# Patient Record
Sex: Female | Born: 1950 | Race: White | Hispanic: No | Marital: Married | Smoking: Former smoker
Health system: Southern US, Community
[De-identification: ages and names within clinical notes are randomized; demographics above are authoritative.]

## PROBLEM LIST (undated history)

## (undated) DIAGNOSIS — C7931 Secondary malignant neoplasm of brain: Secondary | ICD-10-CM

## (undated) DIAGNOSIS — C169 Malignant neoplasm of stomach, unspecified: Secondary | ICD-10-CM

## (undated) DIAGNOSIS — H409 Unspecified glaucoma: Secondary | ICD-10-CM

## (undated) DIAGNOSIS — C779 Secondary and unspecified malignant neoplasm of lymph node, unspecified: Secondary | ICD-10-CM

## (undated) DIAGNOSIS — F419 Anxiety disorder, unspecified: Secondary | ICD-10-CM

## (undated) DIAGNOSIS — K219 Gastro-esophageal reflux disease without esophagitis: Secondary | ICD-10-CM

## (undated) DIAGNOSIS — C50911 Malignant neoplasm of unspecified site of right female breast: Secondary | ICD-10-CM

## (undated) DIAGNOSIS — M1990 Unspecified osteoarthritis, unspecified site: Secondary | ICD-10-CM

## (undated) DIAGNOSIS — Z8489 Family history of other specified conditions: Secondary | ICD-10-CM

## (undated) HISTORY — DX: Unspecified osteoarthritis, unspecified site: M19.90

## (undated) HISTORY — DX: Secondary malignant neoplasm of brain: C79.31

## (undated) HISTORY — DX: Malignant neoplasm of unspecified site of right female breast: C50.911

## (undated) HISTORY — DX: Malignant neoplasm of stomach, unspecified: C16.9

## (undated) HISTORY — DX: Secondary and unspecified malignant neoplasm of lymph node, unspecified: C77.9

## (undated) HISTORY — PX: ABDOMINAL HYSTERECTOMY: SHX81

## (undated) HISTORY — DX: Gastro-esophageal reflux disease without esophagitis: K21.9

---

## 2004-08-31 ENCOUNTER — Emergency Department: Payer: Self-pay | Admitting: Emergency Medicine

## 2004-09-29 ENCOUNTER — Ambulatory Visit: Payer: Self-pay | Admitting: Internal Medicine

## 2005-11-04 ENCOUNTER — Ambulatory Visit: Payer: Self-pay | Admitting: Internal Medicine

## 2006-11-17 ENCOUNTER — Ambulatory Visit: Payer: Self-pay | Admitting: Internal Medicine

## 2007-11-23 ENCOUNTER — Ambulatory Visit: Payer: Self-pay | Admitting: Internal Medicine

## 2008-01-25 ENCOUNTER — Ambulatory Visit: Payer: Self-pay | Admitting: Unknown Physician Specialty

## 2009-02-25 ENCOUNTER — Ambulatory Visit: Payer: Self-pay | Admitting: Internal Medicine

## 2010-07-01 ENCOUNTER — Encounter
Admission: RE | Admit: 2010-07-01 | Discharge: 2010-07-01 | Payer: Self-pay | Source: Home / Self Care | Attending: Specialist | Admitting: Specialist

## 2012-09-02 ENCOUNTER — Ambulatory Visit: Payer: Self-pay | Admitting: Family Medicine

## 2012-09-02 LAB — COMPREHENSIVE METABOLIC PANEL
Albumin: 3.7 g/dL (ref 3.4–5.0)
BUN: 16 mg/dL (ref 7–18)
Chloride: 102 mmol/L (ref 98–107)
Glucose: 98 mg/dL (ref 65–99)
Osmolality: 281 (ref 275–301)
SGPT (ALT): 21 U/L (ref 12–78)
Sodium: 140 mmol/L (ref 136–145)
Total Protein: 7.8 g/dL (ref 6.4–8.2)

## 2012-09-02 LAB — URINALYSIS, COMPLETE: Ph: 6 (ref 4.5–8.0)

## 2012-09-02 LAB — CBC WITH DIFFERENTIAL/PLATELET
Basophil #: 0 10*3/uL (ref 0.0–0.1)
Eosinophil %: 2 %
Lymphocyte #: 1.2 10*3/uL (ref 1.0–3.6)
Monocyte %: 12.8 %
Neutrophil #: 2.7 10*3/uL (ref 1.4–6.5)
RBC: 4.52 10*6/uL (ref 3.80–5.20)
RDW: 15 % — ABNORMAL HIGH (ref 11.5–14.5)
WBC: 4.6 10*3/uL (ref 3.6–11.0)

## 2014-04-08 DIAGNOSIS — K219 Gastro-esophageal reflux disease without esophagitis: Secondary | ICD-10-CM | POA: Insufficient documentation

## 2014-04-11 ENCOUNTER — Ambulatory Visit: Payer: Self-pay | Admitting: Family Medicine

## 2014-04-19 ENCOUNTER — Other Ambulatory Visit (HOSPITAL_COMMUNITY): Payer: Self-pay | Admitting: Diagnostic Radiology

## 2014-04-19 ENCOUNTER — Ambulatory Visit: Payer: Self-pay | Admitting: Surgery

## 2014-04-23 ENCOUNTER — Ambulatory Visit: Payer: Self-pay | Admitting: Oncology

## 2014-04-23 LAB — CBC CANCER CENTER
BASOS ABS: 0 x10 3/mm (ref 0.0–0.1)
BASOS PCT: 0.3 %
EOS ABS: 0.2 x10 3/mm (ref 0.0–0.7)
EOS PCT: 2.9 %
HCT: 35.9 % (ref 35.0–47.0)
HGB: 11.2 g/dL — ABNORMAL LOW (ref 12.0–16.0)
LYMPHS ABS: 1.5 x10 3/mm (ref 1.0–3.6)
LYMPHS PCT: 22.1 %
MCH: 25 pg — AB (ref 26.0–34.0)
MCHC: 31.2 g/dL — AB (ref 32.0–36.0)
MCV: 80 fL (ref 80–100)
Monocyte #: 0.6 x10 3/mm (ref 0.2–0.9)
Monocyte %: 9.6 %
NEUTROS ABS: 4.3 x10 3/mm (ref 1.4–6.5)
Neutrophil %: 65.1 %
PLATELETS: 314 x10 3/mm (ref 150–440)
RBC: 4.48 10*6/uL (ref 3.80–5.20)
RDW: 17 % — AB (ref 11.5–14.5)
WBC: 6.6 x10 3/mm (ref 3.6–11.0)

## 2014-04-23 LAB — COMPREHENSIVE METABOLIC PANEL
ALT: 23 U/L
AST: 15 U/L (ref 15–37)
Albumin: 3.6 g/dL (ref 3.4–5.0)
Alkaline Phosphatase: 84 U/L
Anion Gap: 3 — ABNORMAL LOW (ref 7–16)
BILIRUBIN TOTAL: 0.1 mg/dL — AB (ref 0.2–1.0)
BUN: 16 mg/dL (ref 7–18)
CO2: 30 mmol/L (ref 21–32)
CREATININE: 0.76 mg/dL (ref 0.60–1.30)
Calcium, Total: 8.4 mg/dL — ABNORMAL LOW (ref 8.5–10.1)
Chloride: 108 mmol/L — ABNORMAL HIGH (ref 98–107)
EGFR (African American): >60
GLUCOSE: 100 mg/dL — AB (ref 65–99)
OSMOLALITY: 283 (ref 275–301)
Potassium: 4.3 mmol/L (ref 3.5–5.1)
SODIUM: 141 mmol/L (ref 136–145)
TOTAL PROTEIN: 7.4 g/dL (ref 6.4–8.2)

## 2014-04-24 LAB — IMMUNOHISTOCHEMICAL STAIN(S)

## 2014-04-24 LAB — CANCER ANTIGEN 27.29: CA 27.29: 13.3 U/mL (ref 0.0–38.6)

## 2014-05-01 ENCOUNTER — Ambulatory Visit: Payer: Self-pay | Admitting: Surgery

## 2014-05-01 HISTORY — PX: PORTACATH PLACEMENT: SHX2246

## 2014-05-02 LAB — CBC WITH DIFFERENTIAL/PLATELET
BASOS PCT: 0.4 %
Basophil #: 0 10*3/uL (ref 0.0–0.1)
EOS ABS: 0.1 10*3/uL (ref 0.0–0.7)
Eosinophil %: 2.5 %
HCT: 32.1 % — ABNORMAL LOW (ref 35.0–47.0)
HGB: 10 g/dL — ABNORMAL LOW (ref 12.0–16.0)
LYMPHS ABS: 1.2 10*3/uL (ref 1.0–3.6)
Lymphocyte %: 24.4 %
MCH: 24.8 pg — ABNORMAL LOW (ref 26.0–34.0)
MCHC: 31.3 g/dL — ABNORMAL LOW (ref 32.0–36.0)
MCV: 79 fL — ABNORMAL LOW (ref 80–100)
MONO ABS: 0.4 x10 3/mm (ref 0.2–0.9)
MONOS PCT: 8.8 %
NEUTROS ABS: 3.1 10*3/uL (ref 1.4–6.5)
Neutrophil %: 63.9 %
Platelet: 264 10*3/uL (ref 150–440)
RBC: 4.05 10*6/uL (ref 3.80–5.20)
RDW: 16.7 % — ABNORMAL HIGH (ref 11.5–14.5)
WBC: 4.9 10*3/uL (ref 3.6–11.0)

## 2014-05-06 ENCOUNTER — Ambulatory Visit: Payer: Self-pay | Admitting: Oncology

## 2014-05-07 ENCOUNTER — Ambulatory Visit: Payer: Self-pay | Admitting: Oncology

## 2014-05-10 ENCOUNTER — Ambulatory Visit: Payer: Self-pay | Admitting: Gastroenterology

## 2014-05-22 LAB — CBC CANCER CENTER
Basophil #: 0 x10 3/mm (ref 0.0–0.1)
Basophil %: 0.2 %
EOS ABS: 0 x10 3/mm (ref 0.0–0.7)
EOS PCT: 0.2 %
HCT: 35.9 % (ref 35.0–47.0)
HGB: 11 g/dL — AB (ref 12.0–16.0)
Lymphocyte #: 1.9 x10 3/mm (ref 1.0–3.6)
Lymphocyte %: 12 %
MCH: 24 pg — ABNORMAL LOW (ref 26.0–34.0)
MCHC: 30.7 g/dL — ABNORMAL LOW (ref 32.0–36.0)
MCV: 78 fL — AB (ref 80–100)
MONO ABS: 0.9 x10 3/mm (ref 0.2–0.9)
MONOS PCT: 5.5 %
NEUTROS ABS: 12.9 x10 3/mm — AB (ref 1.4–6.5)
Neutrophil %: 82.1 %
Platelet: 267 x10 3/mm (ref 150–440)
RBC: 4.6 10*6/uL (ref 3.80–5.20)
RDW: 16.7 % — ABNORMAL HIGH (ref 11.5–14.5)
WBC: 15.8 x10 3/mm — AB (ref 3.6–11.0)

## 2014-05-29 LAB — CBC CANCER CENTER
BASOS ABS: 0 x10 3/mm (ref 0.0–0.1)
Basophil %: 0.4 %
Eosinophil #: 0 x10 3/mm (ref 0.0–0.7)
Eosinophil %: 0.1 %
HCT: 32.2 % — ABNORMAL LOW (ref 35.0–47.0)
HGB: 10.1 g/dL — ABNORMAL LOW (ref 12.0–16.0)
LYMPHS PCT: 18.8 %
Lymphocyte #: 1.7 x10 3/mm (ref 1.0–3.6)
MCH: 24.6 pg — AB (ref 26.0–34.0)
MCHC: 31.4 g/dL — AB (ref 32.0–36.0)
MCV: 79 fL — AB (ref 80–100)
Monocyte #: 0.5 x10 3/mm (ref 0.2–0.9)
Monocyte %: 5.8 %
NEUTROS ABS: 6.9 x10 3/mm — AB (ref 1.4–6.5)
Neutrophil %: 74.9 %
Platelet: 170 x10 3/mm (ref 150–440)
RBC: 4.1 10*6/uL (ref 3.80–5.20)
RDW: 17.5 % — ABNORMAL HIGH (ref 11.5–14.5)
WBC: 9.2 x10 3/mm (ref 3.6–11.0)

## 2014-06-04 ENCOUNTER — Ambulatory Visit: Payer: Self-pay | Admitting: Oncology

## 2014-06-04 LAB — PROTIME-INR
INR: 1
PROTHROMBIN TIME: 12.9 s (ref 11.5–14.7)

## 2014-06-05 LAB — CBC CANCER CENTER
BASOS ABS: 0 x10 3/mm (ref 0.0–0.1)
BASOS PCT: 0.5 %
Eosinophil #: 0 x10 3/mm (ref 0.0–0.7)
Eosinophil %: 0.2 %
HCT: 29.9 % — ABNORMAL LOW (ref 35.0–47.0)
HGB: 9.4 g/dL — ABNORMAL LOW (ref 12.0–16.0)
LYMPHS ABS: 1.4 x10 3/mm (ref 1.0–3.6)
Lymphocyte %: 29.1 %
MCH: 24.8 pg — ABNORMAL LOW (ref 26.0–34.0)
MCHC: 31.5 g/dL — AB (ref 32.0–36.0)
MCV: 79 fL — AB (ref 80–100)
MONO ABS: 0.4 x10 3/mm (ref 0.2–0.9)
MONOS PCT: 8.3 %
Neutrophil #: 3 x10 3/mm (ref 1.4–6.5)
Neutrophil %: 61.9 %
Platelet: 217 x10 3/mm (ref 150–440)
RBC: 3.79 10*6/uL — ABNORMAL LOW (ref 3.80–5.20)
RDW: 18 % — AB (ref 11.5–14.5)
WBC: 4.8 x10 3/mm (ref 3.6–11.0)

## 2014-06-07 ENCOUNTER — Ambulatory Visit: Payer: Self-pay | Admitting: Oncology

## 2014-06-10 LAB — CBC CANCER CENTER
BASOS ABS: 0 x10 3/mm (ref 0.0–0.1)
Basophil %: 0.7 %
EOS ABS: 0.1 x10 3/mm (ref 0.0–0.7)
Eosinophil %: 1.6 %
HCT: 31.5 % — AB (ref 35.0–47.0)
HGB: 9.8 g/dL — ABNORMAL LOW (ref 12.0–16.0)
Lymphocyte #: 1.1 x10 3/mm (ref 1.0–3.6)
Lymphocyte %: 27.7 %
MCH: 24.3 pg — AB (ref 26.0–34.0)
MCHC: 31.1 g/dL — AB (ref 32.0–36.0)
MCV: 78 fL — AB (ref 80–100)
MONOS PCT: 9.9 %
Monocyte #: 0.4 x10 3/mm (ref 0.2–0.9)
Neutrophil #: 2.5 x10 3/mm (ref 1.4–6.5)
Neutrophil %: 60.1 %
PLATELETS: 216 x10 3/mm (ref 150–440)
RBC: 4.03 10*6/uL (ref 3.80–5.20)
RDW: 18.6 % — AB (ref 11.5–14.5)
WBC: 4.1 x10 3/mm (ref 3.6–11.0)

## 2014-06-10 LAB — COMPREHENSIVE METABOLIC PANEL
ALK PHOS: 84 U/L
Albumin: 3.2 g/dL — ABNORMAL LOW (ref 3.4–5.0)
Anion Gap: 8 (ref 7–16)
BUN: 19 mg/dL — AB (ref 7–18)
Bilirubin,Total: 0.2 mg/dL (ref 0.2–1.0)
CALCIUM: 8.1 mg/dL — AB (ref 8.5–10.1)
CO2: 27 mmol/L (ref 21–32)
CREATININE: 0.73 mg/dL (ref 0.60–1.30)
Chloride: 104 mmol/L (ref 98–107)
EGFR (African American): >60
GLUCOSE: 93 mg/dL (ref 65–99)
OSMOLALITY: 279 (ref 275–301)
Potassium: 3.8 mmol/L (ref 3.5–5.1)
SGOT(AST): 12 U/L — ABNORMAL LOW (ref 15–37)
SGPT (ALT): 19 U/L
SODIUM: 139 mmol/L (ref 136–145)
Total Protein: 6.6 g/dL (ref 6.4–8.2)

## 2014-06-17 LAB — CBC CANCER CENTER
Basophil #: 0 x10 3/mm (ref 0.0–0.1)
Basophil %: 0.3 %
EOS PCT: 0.3 %
Eosinophil #: 0 x10 3/mm (ref 0.0–0.7)
HCT: 30.5 % — ABNORMAL LOW (ref 35.0–47.0)
HGB: 9.5 g/dL — ABNORMAL LOW (ref 12.0–16.0)
Lymphocyte #: 1.5 x10 3/mm (ref 1.0–3.6)
Lymphocyte %: 23.3 %
MCH: 24.5 pg — ABNORMAL LOW (ref 26.0–34.0)
MCHC: 31.2 g/dL — ABNORMAL LOW (ref 32.0–36.0)
MCV: 79 fL — ABNORMAL LOW (ref 80–100)
MONOS PCT: 10.4 %
Monocyte #: 0.7 x10 3/mm (ref 0.2–0.9)
NEUTROS PCT: 65.7 %
Neutrophil #: 4.3 x10 3/mm (ref 1.4–6.5)
PLATELETS: 158 x10 3/mm (ref 150–440)
RBC: 3.88 10*6/uL (ref 3.80–5.20)
RDW: 18.7 % — ABNORMAL HIGH (ref 11.5–14.5)
WBC: 6.5 x10 3/mm (ref 3.6–11.0)

## 2014-06-24 LAB — CBC CANCER CENTER
Basophil #: 0 x10 3/mm (ref 0.0–0.1)
Basophil %: 0.2 %
EOS PCT: 0.1 %
Eosinophil #: 0 x10 3/mm (ref 0.0–0.7)
HCT: 29.4 % — AB (ref 35.0–47.0)
HGB: 9.2 g/dL — ABNORMAL LOW (ref 12.0–16.0)
LYMPHS ABS: 1.4 x10 3/mm (ref 1.0–3.6)
Lymphocyte %: 23.3 %
MCH: 25.1 pg — AB (ref 26.0–34.0)
MCHC: 31.4 g/dL — ABNORMAL LOW (ref 32.0–36.0)
MCV: 80 fL (ref 80–100)
MONOS PCT: 6.7 %
Monocyte #: 0.4 x10 3/mm (ref 0.2–0.9)
NEUTROS ABS: 4 x10 3/mm (ref 1.4–6.5)
Neutrophil %: 69.7 %
Platelet: 188 x10 3/mm (ref 150–440)
RBC: 3.68 10*6/uL — AB (ref 3.80–5.20)
RDW: 20 % — AB (ref 11.5–14.5)
WBC: 5.8 x10 3/mm (ref 3.6–11.0)

## 2014-07-01 LAB — COMPREHENSIVE METABOLIC PANEL
ALBUMIN: 3.2 g/dL — AB (ref 3.4–5.0)
ALK PHOS: 86 U/L
ANION GAP: 9 (ref 7–16)
BUN: 15 mg/dL (ref 7–18)
Bilirubin,Total: 0.2 mg/dL (ref 0.2–1.0)
CREATININE: 0.79 mg/dL (ref 0.60–1.30)
Calcium, Total: 7.9 mg/dL — ABNORMAL LOW (ref 8.5–10.1)
Chloride: 106 mmol/L (ref 98–107)
Co2: 26 mmol/L (ref 21–32)
EGFR (Non-African Amer.): >60
GLUCOSE: 120 mg/dL — AB (ref 65–99)
Osmolality: 283 (ref 275–301)
POTASSIUM: 4.1 mmol/L (ref 3.5–5.1)
SGOT(AST): 13 U/L — ABNORMAL LOW (ref 15–37)
SGPT (ALT): 19 U/L
SODIUM: 141 mmol/L (ref 136–145)
Total Protein: 6.4 g/dL (ref 6.4–8.2)

## 2014-07-01 LAB — CBC CANCER CENTER
Basophil #: 0 x10 3/mm (ref 0.0–0.1)
Basophil %: 0.5 %
EOS ABS: 0 x10 3/mm (ref 0.0–0.7)
EOS PCT: 0.2 %
HCT: 31.4 % — ABNORMAL LOW (ref 35.0–47.0)
HGB: 9.8 g/dL — ABNORMAL LOW (ref 12.0–16.0)
LYMPHS ABS: 1.4 x10 3/mm (ref 1.0–3.6)
Lymphocyte %: 39.2 %
MCH: 24.9 pg — AB (ref 26.0–34.0)
MCHC: 31.2 g/dL — AB (ref 32.0–36.0)
MCV: 80 fL (ref 80–100)
MONO ABS: 0.4 x10 3/mm (ref 0.2–0.9)
Monocyte %: 10.7 %
NEUTROS PCT: 49.4 %
Neutrophil #: 1.8 x10 3/mm (ref 1.4–6.5)
PLATELETS: 214 x10 3/mm (ref 150–440)
RBC: 3.94 10*6/uL (ref 3.80–5.20)
RDW: 19.7 % — ABNORMAL HIGH (ref 11.5–14.5)
WBC: 3.7 x10 3/mm (ref 3.6–11.0)

## 2014-07-08 ENCOUNTER — Ambulatory Visit: Payer: Self-pay | Admitting: Oncology

## 2014-07-08 LAB — CBC CANCER CENTER
BASOS PCT: 0.2 %
Basophil #: 0 x10 3/mm (ref 0.0–0.1)
EOS PCT: 0.3 %
Eosinophil #: 0 x10 3/mm (ref 0.0–0.7)
HCT: 28.3 % — ABNORMAL LOW (ref 35.0–47.0)
HGB: 9.1 g/dL — AB (ref 12.0–16.0)
LYMPHS PCT: 19.6 %
Lymphocyte #: 1.7 x10 3/mm (ref 1.0–3.6)
MCH: 25.4 pg — ABNORMAL LOW (ref 26.0–34.0)
MCHC: 32.3 g/dL (ref 32.0–36.0)
MCV: 79 fL — ABNORMAL LOW (ref 80–100)
MONO ABS: 1.1 x10 3/mm — AB (ref 0.2–0.9)
MONOS PCT: 13.3 %
NEUTROS PCT: 66.6 %
Neutrophil #: 5.8 x10 3/mm (ref 1.4–6.5)
PLATELETS: 177 x10 3/mm (ref 150–440)
RBC: 3.6 10*6/uL — AB (ref 3.80–5.20)
RDW: 20.2 % — ABNORMAL HIGH (ref 11.5–14.5)
WBC: 8.6 x10 3/mm (ref 3.6–11.0)

## 2014-07-15 LAB — CBC CANCER CENTER
Basophil #: 0 x10 3/mm (ref 0.0–0.1)
Basophil %: 0.3 %
Eosinophil #: 0 x10 3/mm (ref 0.0–0.7)
Eosinophil %: 0.1 %
HCT: 30.2 % — ABNORMAL LOW (ref 35.0–47.0)
HGB: 9.6 g/dL — ABNORMAL LOW (ref 12.0–16.0)
Lymphocyte %: 28.4 %
Lymphs Abs: 1.9 x10 3/mm (ref 1.0–3.6)
MCH: 25.6 pg — ABNORMAL LOW (ref 26.0–34.0)
MCHC: 31.7 g/dL — ABNORMAL LOW (ref 32.0–36.0)
MCV: 81 fL (ref 80–100)
Monocyte #: 0.6 x10 3/mm (ref 0.2–0.9)
Monocyte %: 9 %
Neutrophil #: 4.1 x10 3/mm (ref 1.4–6.5)
Neutrophil %: 62.2 %
Platelet: 175 x10 3/mm (ref 150–440)
RBC: 3.74 x10 6/mm — ABNORMAL LOW (ref 3.80–5.20)
RDW: 21.4 % — ABNORMAL HIGH (ref 11.5–14.5)
WBC: 6.5 x10 3/mm (ref 3.6–11.0)

## 2014-08-06 ENCOUNTER — Ambulatory Visit
Admit: 2014-08-06 | Disposition: A | Discharge status: Home / Self Care | Payer: Self-pay | Attending: Oncology | Admitting: Oncology

## 2014-09-03 LAB — CBC CANCER CENTER
BASOS ABS: 0 x10 3/mm (ref 0.0–0.1)
Basophil %: 0.6 %
Eosinophil #: 0 x10 3/mm (ref 0.0–0.7)
Eosinophil %: 0.1 %
HCT: 31.2 % — ABNORMAL LOW (ref 35.0–47.0)
HGB: 10.2 g/dL — AB (ref 12.0–16.0)
LYMPHS ABS: 1 x10 3/mm (ref 1.0–3.6)
LYMPHS PCT: 29.7 %
MCH: 28.4 pg (ref 26.0–34.0)
MCHC: 32.6 g/dL (ref 32.0–36.0)
MCV: 87 fL (ref 80–100)
MONOS PCT: 12.9 %
Monocyte #: 0.4 x10 3/mm (ref 0.2–0.9)
NEUTROS PCT: 56.7 %
Neutrophil #: 1.9 x10 3/mm (ref 1.4–6.5)
PLATELETS: 168 x10 3/mm (ref 150–440)
RBC: 3.57 10*6/uL — ABNORMAL LOW (ref 3.80–5.20)
RDW: 20.7 % — AB (ref 11.5–14.5)
WBC: 3.3 x10 3/mm — AB (ref 3.6–11.0)

## 2014-09-03 LAB — COMPREHENSIVE METABOLIC PANEL
ALK PHOS: 59 U/L
ALT: 8 U/L — AB
AST: 16 U/L
Albumin: 3.8 g/dL
Anion Gap: 8 (ref 7–16)
BUN: 8 mg/dL
Bilirubin,Total: 0.4 mg/dL
CALCIUM: 8.6 mg/dL — AB
CHLORIDE: 100 mmol/L — AB
CO2: 27 mmol/L
Creatinine: 0.68 mg/dL
EGFR (African American): >60
Glucose: 92 mg/dL
POTASSIUM: 3.8 mmol/L
Sodium: 135 mmol/L
Total Protein: 6.4 g/dL — ABNORMAL LOW

## 2014-09-03 LAB — CREATININE, SERUM: Creatine, Serum: 0.68

## 2014-09-03 LAB — MAGNESIUM: MAGNESIUM: 1.9 mg/dL

## 2014-09-06 ENCOUNTER — Ambulatory Visit
Admit: 2014-09-06 | Disposition: A | Discharge status: Home / Self Care | Payer: Self-pay | Attending: Oncology | Admitting: Oncology

## 2014-09-10 LAB — CBC CANCER CENTER
Basophil #: 0 x10 3/mm (ref 0.0–0.1)
Basophil %: 0.3 %
EOS PCT: 0.2 %
Eosinophil #: 0 x10 3/mm (ref 0.0–0.7)
HCT: 29.6 % — AB (ref 35.0–47.0)
HGB: 9.8 g/dL — ABNORMAL LOW (ref 12.0–16.0)
LYMPHS ABS: 0.8 x10 3/mm — AB (ref 1.0–3.6)
Lymphocyte %: 27.5 %
MCH: 28.9 pg (ref 26.0–34.0)
MCHC: 33.2 g/dL (ref 32.0–36.0)
MCV: 87 fL (ref 80–100)
MONO ABS: 0.6 x10 3/mm (ref 0.2–0.9)
Monocyte %: 18 %
NEUTROS ABS: 1.7 x10 3/mm (ref 1.4–6.5)
Neutrophil %: 54 %
Platelet: 82 x10 3/mm — ABNORMAL LOW (ref 150–440)
RBC: 3.4 10*6/uL — ABNORMAL LOW (ref 3.80–5.20)
RDW: 19.3 % — AB (ref 11.5–14.5)
WBC: 3.1 x10 3/mm — ABNORMAL LOW (ref 3.6–11.0)

## 2014-09-10 LAB — POTASSIUM: Potassium: 3.6 mmol/L

## 2014-09-10 LAB — MAGNESIUM: MAGNESIUM: 1.7 mg/dL

## 2014-09-17 LAB — CBC CANCER CENTER
BASOS PCT: 0.2 %
Basophil #: 0 x10 3/mm (ref 0.0–0.1)
EOS PCT: 0 %
Eosinophil #: 0 x10 3/mm (ref 0.0–0.7)
HCT: 31.8 % — ABNORMAL LOW (ref 35.0–47.0)
HGB: 10.5 g/dL — ABNORMAL LOW (ref 12.0–16.0)
LYMPHS ABS: 1.1 x10 3/mm (ref 1.0–3.6)
Lymphocyte %: 18 %
MCH: 29.2 pg (ref 26.0–34.0)
MCHC: 33.1 g/dL (ref 32.0–36.0)
MCV: 88 fL (ref 80–100)
MONO ABS: 0.4 x10 3/mm (ref 0.2–0.9)
Monocyte %: 6.8 %
Neutrophil #: 4.8 x10 3/mm (ref 1.4–6.5)
Neutrophil %: 75 %
PLATELETS: 121 x10 3/mm — AB (ref 150–440)
RBC: 3.61 10*6/uL — ABNORMAL LOW (ref 3.80–5.20)
RDW: 19.5 % — ABNORMAL HIGH (ref 11.5–14.5)
WBC: 6.4 x10 3/mm (ref 3.6–11.0)

## 2014-09-24 LAB — COMPREHENSIVE METABOLIC PANEL
ALBUMIN: 3.7 g/dL
ANION GAP: 6 — AB (ref 7–16)
AST: 18 U/L
Alkaline Phosphatase: 59 U/L
BUN: 13 mg/dL
Bilirubin,Total: 0.2 mg/dL — ABNORMAL LOW
CALCIUM: 8.4 mg/dL — AB
CO2: 30 mmol/L
Chloride: 101 mmol/L
Creatinine: 0.77 mg/dL
Glucose: 114 mg/dL — ABNORMAL HIGH
Potassium: 4.2 mmol/L
SGPT (ALT): 11 U/L — ABNORMAL LOW
SODIUM: 137 mmol/L
Total Protein: 6.2 g/dL — ABNORMAL LOW

## 2014-09-24 LAB — CBC CANCER CENTER
BASOS ABS: 0 x10 3/mm (ref 0.0–0.1)
BASOS PCT: 0.3 %
Eosinophil #: 0 x10 3/mm (ref 0.0–0.7)
Eosinophil %: 0.1 %
HCT: 28.8 % — AB (ref 35.0–47.0)
HGB: 9.4 g/dL — AB (ref 12.0–16.0)
LYMPHS ABS: 1.1 x10 3/mm (ref 1.0–3.6)
Lymphocyte %: 22.8 %
MCH: 29.3 pg (ref 26.0–34.0)
MCHC: 32.5 g/dL (ref 32.0–36.0)
MCV: 90 fL (ref 80–100)
MONOS PCT: 10.4 %
Monocyte #: 0.5 x10 3/mm (ref 0.2–0.9)
NEUTROS ABS: 3.1 x10 3/mm (ref 1.4–6.5)
Neutrophil %: 66.4 %
Platelet: 100 x10 3/mm — ABNORMAL LOW (ref 150–440)
RBC: 3.2 10*6/uL — ABNORMAL LOW (ref 3.80–5.20)
RDW: 19.4 % — AB (ref 11.5–14.5)
WBC: 4.6 x10 3/mm (ref 3.6–11.0)

## 2014-09-24 LAB — MAGNESIUM: Magnesium: 2 mg/dL

## 2014-09-27 ENCOUNTER — Ambulatory Visit
Admit: 2014-09-27 | Disposition: A | Discharge status: Home / Self Care | Payer: Self-pay | Attending: Gastroenterology | Admitting: Gastroenterology

## 2014-09-28 NOTE — Op Note (Signed)
PATIENT NAME:  Kimberly Thornton, HIERONYMUS MR#:  [id number] DATE OF BIRTH:  1951/01/07  DATE OF PROCEDURE:  05/01/2014  PREOPERATIVE DIAGNOSIS: Breast cancer.   PREOPERATIVE DIAGNOSIS: Breast cancer.   OPERATION: Port-A-Cath placement.   SURGEON: Rodena Goldmann III, MD  ANESTHESIA: Monitored anesthetic care.   INTRAOPERATIVE CONSULTATION: Katha Cabal, MD  OPERATIVE PROCEDURE: With the patient in the supine position after the induction of appropriate intravenous sedation, the patient's left chest and neck were prepped with ChloraPrep and draped with sterile towels. The left side was chosen because of her breast carcinoma on the right side. A shoulder roll was placed. The patient was appropriately positioned. A small incision was made under the clavicle on the left side. The subclavian vein approach was utilized. The subclavian artery, was cannulated twice with a needle. I could not identify the vein on that side and was unable to cannulate the vein. I abandoned that procedure, packed that wound, moved to the left neck. A second incision was made in the left internal jugular area under Xylocaine anesthesia, and the jugular vein visualized with the ultrasound. The vein was cannulated on a single pass, and a wire passed into the system. However, the wire went with some difficulty, and fluoroscopy identified that it was moving out into the left subclavian vein. Persistent manipulation under ultrasound would not allow it to pass into the great vessel system. I elected to pass the introducer gently into the great vessel system, in an effort to allow the softer catheter to move across the midline. However, after putting the catheter in and injecting some contrast, it was clear there was contrast in the mediastinum in an extravascular position. I postulated that there was an injury to the great vessel system in placing either the wire or the introducer. At that point, I consulted vascular surgery. Dr. Katha Cabal was gracious enough to come to the Operating Room and assess the situation. He felt the vascular injury was not critical, and felt that we should proceed with the procedure. We went on and recannulated the vein with a micropuncture technique, and passed the micropuncture wire under fluoroscopy across into the great vessels/innominate system, toward the right side. The obturator was removed and the larger wire placed under direct vision with fluoroscopy into the right-sided system. The introducer dilator was placed back over  the wire and the dilator removed, leaving the wire in place. The shortened catheter was inserted over the wire, through the introducer using the wire guide to guide it into the great vessel system. It was placed just above the atrium on the right side. The introducer was removed. The catheter position appeared to be satisfactory. It was then tunneled to the second site, where a transverse incision was made in an anesthetized area, and a subcutaneous suprafascial pocket was created. The device was tunneled to that pocket, attached to heparin filled Port-A-Cath device, and then sutured to the chest wall using 3-0 silk. Conray was used to confirm the positioning and identifying that there were no leaks or kinks. The placement appeared to be satisfactory. There was no increase in the size of the mediastinal hematoma. The subcutaneous space was obliterated with 3-0 Vicryl. The skin was closed with a running suture of 4-0 nylon. Sterile dressings were applied. The patient was returned to the recovery room, having tolerated the procedure well. Sponge, instrument and needle counts were correct x 2 in the Operating Room.    ____________________________ Micheline Maze, MD rle:MT  D: 05/01/2014 12:53:58 ET T: 05/01/2014 13:43:13 ET JOB#: [id number]  cc: Micheline Maze, MD, <Dictator> Martie Lee. Oliva Bustard, MD Katha Cabal, MD Rodena Goldmann MD ELECTRONICALLY SIGNED 05/03/2014 21:18

## 2014-09-30 LAB — SURGICAL PATHOLOGY

## 2014-10-03 LAB — SURGICAL PATHOLOGY

## 2014-10-11 ENCOUNTER — Other Ambulatory Visit: Payer: Self-pay | Admitting: *Deleted

## 2014-10-11 DIAGNOSIS — C50919 Malignant neoplasm of unspecified site of unspecified female breast: Secondary | ICD-10-CM

## 2014-10-15 ENCOUNTER — Inpatient Hospital Stay: Payer: BLUE CROSS/BLUE SHIELD | Attending: Oncology

## 2014-10-15 ENCOUNTER — Other Ambulatory Visit: Payer: Self-pay | Admitting: *Deleted

## 2014-10-15 ENCOUNTER — Inpatient Hospital Stay: Payer: BLUE CROSS/BLUE SHIELD

## 2014-10-15 ENCOUNTER — Inpatient Hospital Stay (HOSPITAL_BASED_OUTPATIENT_CLINIC_OR_DEPARTMENT_OTHER): Payer: BLUE CROSS/BLUE SHIELD | Admitting: Oncology

## 2014-10-15 VITALS — BP 134/81 | HR 54 | Temp 95.9°F | Wt 117.1 lb

## 2014-10-15 DIAGNOSIS — C50411 Malignant neoplasm of upper-outer quadrant of right female breast: Secondary | ICD-10-CM | POA: Diagnosis present

## 2014-10-15 DIAGNOSIS — R5383 Other fatigue: Secondary | ICD-10-CM | POA: Insufficient documentation

## 2014-10-15 DIAGNOSIS — R531 Weakness: Secondary | ICD-10-CM | POA: Diagnosis not present

## 2014-10-15 DIAGNOSIS — C50919 Malignant neoplasm of unspecified site of unspecified female breast: Secondary | ICD-10-CM

## 2014-10-15 DIAGNOSIS — Z79899 Other long term (current) drug therapy: Secondary | ICD-10-CM

## 2014-10-15 DIAGNOSIS — R634 Abnormal weight loss: Secondary | ICD-10-CM | POA: Diagnosis not present

## 2014-10-15 DIAGNOSIS — C169 Malignant neoplasm of stomach, unspecified: Secondary | ICD-10-CM

## 2014-10-15 DIAGNOSIS — C778 Secondary and unspecified malignant neoplasm of lymph nodes of multiple regions: Secondary | ICD-10-CM

## 2014-10-15 DIAGNOSIS — F411 Generalized anxiety disorder: Secondary | ICD-10-CM | POA: Insufficient documentation

## 2014-10-15 DIAGNOSIS — Z171 Estrogen receptor negative status [ER-]: Secondary | ICD-10-CM | POA: Diagnosis not present

## 2014-10-15 DIAGNOSIS — K769 Liver disease, unspecified: Secondary | ICD-10-CM | POA: Diagnosis not present

## 2014-10-15 DIAGNOSIS — M1990 Unspecified osteoarthritis, unspecified site: Secondary | ICD-10-CM | POA: Diagnosis not present

## 2014-10-15 DIAGNOSIS — C162 Malignant neoplasm of body of stomach: Secondary | ICD-10-CM

## 2014-10-15 DIAGNOSIS — C50911 Malignant neoplasm of unspecified site of right female breast: Secondary | ICD-10-CM

## 2014-10-15 DIAGNOSIS — Z5111 Encounter for antineoplastic chemotherapy: Secondary | ICD-10-CM | POA: Insufficient documentation

## 2014-10-15 DIAGNOSIS — K219 Gastro-esophageal reflux disease without esophagitis: Secondary | ICD-10-CM | POA: Insufficient documentation

## 2014-10-15 LAB — CBC WITH DIFFERENTIAL/PLATELET
BASOS ABS: 0 10*3/uL (ref 0–0.1)
BASOS PCT: 0 %
EOS PCT: 1 %
Eosinophils Absolute: 0 10*3/uL (ref 0–0.7)
HEMATOCRIT: 30.2 % — AB (ref 35.0–47.0)
Hemoglobin: 9.8 g/dL — ABNORMAL LOW (ref 12.0–16.0)
Lymphocytes Relative: 31 %
Lymphs Abs: 1.1 10*3/uL (ref 1.0–3.6)
MCH: 29.5 pg (ref 26.0–34.0)
MCHC: 32.5 g/dL (ref 32.0–36.0)
MCV: 90.6 fL (ref 80.0–100.0)
Monocytes Absolute: 0.3 10*3/uL (ref 0.2–0.9)
Monocytes Relative: 9 %
NEUTROS ABS: 2.1 10*3/uL (ref 1.4–6.5)
Neutrophils Relative %: 59 %
Platelets: 158 10*3/uL (ref 150–440)
RBC: 3.33 MIL/uL — ABNORMAL LOW (ref 3.80–5.20)
RDW: 18.1 % — AB (ref 11.5–14.5)
WBC: 3.6 10*3/uL (ref 3.6–11.0)

## 2014-10-15 LAB — COMPREHENSIVE METABOLIC PANEL
ALK PHOS: 61 U/L (ref 38–126)
ALT: 10 U/L — AB (ref 14–54)
AST: 13 U/L — ABNORMAL LOW (ref 15–41)
Albumin: 3.9 g/dL (ref 3.5–5.0)
Anion gap: 5 (ref 5–15)
BUN: 17 mg/dL (ref 6–20)
CALCIUM: 8.4 mg/dL — AB (ref 8.9–10.3)
CO2: 27 mmol/L (ref 22–32)
Chloride: 103 mmol/L (ref 101–111)
Creatinine, Ser: 0.65 mg/dL (ref 0.44–1.00)
GFR calc Af Amer: >60 mL/min (ref >60)
GLUCOSE: 95 mg/dL (ref 65–99)
Potassium: 3.7 mmol/L (ref 3.5–5.1)
Sodium: 135 mmol/L (ref 135–145)
Total Bilirubin: 0.4 mg/dL (ref 0.3–1.2)
Total Protein: 6.9 g/dL (ref 6.5–8.1)

## 2014-10-15 LAB — MAGNESIUM: MAGNESIUM: 2.1 mg/dL (ref 1.7–2.4)

## 2014-10-15 MED ORDER — METOCLOPRAMIDE HCL 10 MG PO TABS: 10.0000 mg | ORAL_TABLET | Freq: Two times a day (BID) | ORAL | Status: DC

## 2014-10-15 MED ORDER — RANITIDINE HCL 150 MG PO CAPS: 150.0000 mg | ORAL_CAPSULE | Freq: Two times a day (BID) | ORAL | Status: DC

## 2014-10-15 MED ORDER — HEPARIN SOD (PORK) LOCK FLUSH 100 UNIT/ML IV SOLN
500.0000 [IU] | Freq: Once | INTRAVENOUS | Status: AC
  Administered 2014-10-15: 500 [IU] via INTRAVENOUS
  Filled 2014-10-15: qty 5

## 2014-10-15 MED ORDER — SODIUM CHLORIDE 0.9 % IV SOLN
6.0000 mg/kg | Freq: Once | INTRAVENOUS | Status: AC
  Administered 2014-10-15: 315 mg via INTRAVENOUS
  Filled 2014-10-15: qty 15

## 2014-10-15 MED ORDER — ACETAMINOPHEN 325 MG PO TABS
650.0000 mg | ORAL_TABLET | Freq: Once | ORAL | Status: AC
  Administered 2014-10-15: 650 mg via ORAL
  Filled 2014-10-15: qty 2

## 2014-10-15 MED ORDER — DIPHENHYDRAMINE HCL 25 MG PO CAPS
50.0000 mg | ORAL_CAPSULE | Freq: Once | ORAL | Status: AC
  Administered 2014-10-15: 50 mg via ORAL
  Filled 2014-10-15: qty 2

## 2014-10-15 MED ORDER — SODIUM CHLORIDE 0.9 % IV SOLN
INTRAVENOUS | Status: DC
  Administered 2014-10-15: 16:00:00 via INTRAVENOUS
  Filled 2014-10-15: qty 250

## 2014-10-15 MED ORDER — SODIUM CHLORIDE 0.9 % IJ SOLN
10.0000 mL | INTRAMUSCULAR | Status: DC | PRN
  Administered 2014-10-15: 10 mL via INTRAVENOUS
  Filled 2014-10-15: qty 10

## 2014-10-19 ENCOUNTER — Encounter: Payer: Self-pay | Admitting: Oncology

## 2014-10-19 DIAGNOSIS — C162 Malignant neoplasm of body of stomach: Secondary | ICD-10-CM | POA: Insufficient documentation

## 2014-10-19 NOTE — Progress Notes (Signed)
[place] @ [facility] [phone]) [phone]  Fax:(336) [place] OB: May 27, 1951  MR#: [id number]  JKK#:[id number]  Patient Care Team: Sofie Hartigan, MD as PCP - General (Family Medicine)  CHIEF COMPLAINT:  Chief Complaint  Patient presents with  . Follow-up    Oncology History   Subjective: Chief Complaint/Diagnosis:   1. Carcinoma of breast right upper quadrant  (4 cm tumor mass) 3 positive lymph node on needle biopsy estrogen receptor progesterone receptor negative.  HER-2/neu receptor positiveby IHC 3+ Diagnosis November, 2015, T2 N1 M0 tumor stage II disease 2. Muga SCAN OF THE HEART EJECTION FRACTION 66% 3. Abnormal PET scan suggesting a mass in the stomach, and liver metastases 4. Upper endoscopy given fungating mass biopsies consistent with stomach primary cancer, HER-2/neu by IHC 2+ and by fish is 1.86. 5. Liver biopsies negative for any malignancy. 6. BRCA 2 sequencing deleterious mutation in 2014 ins As mentioned above. 7. ENDOSCOPY ULTRASOUND SHOWS STOMACH TUMOR TO BE t3 n0 m0 8.  Has finished total 6 cycles of chemotherapy with Taxotere, carboplatinum, Herceptin with overall good response in the breast as well as, cancer (April of 2016) on maintenance Herceptin therapy from May of 2016     Breast cancer   10/15/2014 Initial Diagnosis Breast cancer    Oncology Flowsheet 10/15/2014  diphenhydrAMINE (BENADRYL) PO 50 mg  trastuzumab (HERCEPTIN) IV 6 mg/kg    INTERVAL HISTORY: 64 year old lady who went upper endoscopy done revealed the stomach tumor to be decrease in size biopsy was negative.  Appetite is improving patient continues to be very apprehensive.  Here to initiate maintenance Herceptin therapy.  No chills.  No fever.  No nausea or vomiting  REVIEW OF SYSTEMS:   Gen. status: Patient is feeling weak and tired has lost weight.  Continues to work.  Performance status remains fairly good of 0.1.  HEENT: Alopecia.  No difficulty  swallowing.  Lungs: Shortness of breath on exertion.  No cough.  Cardiac: No chest pain.  GI: No nausea no vomiting appetite is gradually improving.  No rectal bleeding.  Recent upper endoscopy.  GU: No dysuria hematuria musculoskeletal cystine no bony pains no joint swelling skin: No rashes neurological system no headache no dizziness no tingling numbness  As per HPI. Otherwise, a complete review of systems is negatve.  PAST MEDICAL HISTORY: Past Medical History  Diagnosis Date  . Breast cancer   . Arthritis   . Asthma     PAST SURGICAL HISTORY: Past Surgical History  Procedure Laterality Date  . Cholecystectomy      FAMILY HISTORY No family history on file.  GYNECOLOGIC HISTORY:  No LMP recorded.     ADVANCED DIRECTIVES:    HEALTH MAINTENANCE: History  Substance Use Topics  . Smoking status: Never Smoker   . Smokeless tobacco: Not on file  . Alcohol Use: Not on file     Colonoscopy:  PAP:  Bone density:  Lipid panel:  Allergies  Allergen Reactions  . Codeine Other (See Comments) and Nausea Only    GI distress    Current Outpatient Prescriptions  Medication Sig Dispense Refill  . ALPRAZolam (XANAX) 0.25 MG tablet Take 0.25 mg by mouth 2 (two) times daily at 8 am and 10 pm.    . brimonidine-timolol (COMBIGAN) 0.2-0.5 % ophthalmic solution Place 1 drop into both eyes every 12 (twelve) hours.    Marland Kitchen LORazepam (ATIVAN) 0.5 MG tablet Take 0.5 mg by mouth at bedtime as needed for  anxiety.    Marland Kitchen omeprazole (PRILOSEC) 40 MG capsule Take 40 mg by mouth daily.    . metoCLOPramide (REGLAN) 10 MG tablet Take 1 tablet (10 mg total) by mouth 2 (two) times daily. 60 tablet 3  . ondansetron (ZOFRAN-ODT) 4 MG disintegrating tablet Take by mouth.    . ranitidine (ZANTAC) 150 MG capsule Take 1 capsule (150 mg total) by mouth 2 (two) times daily. 180 capsule 3   No current facility-administered medications for this visit.    OBJECTIVE:  Filed Vitals:   10/15/14 1518  BP:  134/81  Pulse: 54  Temp: 95.9 F (35.5 C)     Body mass index is 23.6 kg/(m^2).    ECOG FS:1 - Symptomatic but completely ambulatory  PHYSICAL EXAM: Gen. status: Patient is alert oriented not any acute distress slightly apprehensive.  Lymphatic system: No palpable supraclavicular cervical or axillary lymphadenopathy examination of breast: Right breast no palpable mass.  Left breast we have masses.  Lungs: Air entry on both sides of dictation (overalls.  Cardiac: Soft systolic murmur.  Abdomen: Soft liver nonpalpable no ascites.  Spleen is not palpable.  Bowel sounds are present.  Lower extremity no swelling.  Neurological system: Heart functions within normal limit cranial nerves are intact no localizing sign skin: No ecchymosis no rash all other system has been reviewed and examined and they are within normal limit   LAB RESULTS:  Infusion on 10/15/2014  Component Date Value Ref Range Status  . WBC 10/15/2014 3.6  3.6 - 11.0 K/uL Final  . RBC 10/15/2014 3.33* 3.80 - 5.20 MIL/uL Final  . Hemoglobin 10/15/2014 9.8* 12.0 - 16.0 g/dL Final  . HCT 10/15/2014 30.2* 35.0 - 47.0 % Final  . MCV 10/15/2014 90.6  80.0 - 100.0 fL Final  . MCH 10/15/2014 29.5  26.0 - 34.0 pg Final  . MCHC 10/15/2014 32.5  32.0 - 36.0 g/dL Final  . RDW 10/15/2014 18.1* 11.5 - 14.5 % Final  . Platelets 10/15/2014 158  150 - 440 K/uL Final  . Neutrophils Relative % 10/15/2014 59   Final  . Neutro Abs 10/15/2014 2.1  1.4 - 6.5 K/uL Final  . Lymphocytes Relative 10/15/2014 31   Final  . Lymphs Abs 10/15/2014 1.1  1.0 - 3.6 K/uL Final  . Monocytes Relative 10/15/2014 9   Final  . Monocytes Absolute 10/15/2014 0.3  0.2 - 0.9 K/uL Final  . Eosinophils Relative 10/15/2014 1   Final  . Eosinophils Absolute 10/15/2014 0.0  0 - 0.7 K/uL Final  . Basophils Relative 10/15/2014 0   Final  . Basophils Absolute 10/15/2014 0.0  0 - 0.1 K/uL Final  . Sodium 10/15/2014 135  135 - 145 mmol/L Final  . Potassium 10/15/2014 3.7  3.5 -  5.1 mmol/L Final  . Chloride 10/15/2014 103  101 - 111 mmol/L Final  . CO2 10/15/2014 27  22 - 32 mmol/L Final  . Glucose, Bld 10/15/2014 95  65 - 99 mg/dL Final  . BUN 10/15/2014 17  6 - 20 mg/dL Final  . Creatinine, Ser 10/15/2014 0.65  0.44 - 1.00 mg/dL Final  . Calcium 10/15/2014 8.4* 8.9 - 10.3 mg/dL Final  . Total Protein 10/15/2014 6.9  6.5 - 8.1 g/dL Final  . Albumin 10/15/2014 3.9  3.5 - 5.0 g/dL Final  . AST 10/15/2014 13* 15 - 41 U/L Final  . ALT 10/15/2014 10* 14 - 54 U/L Final  . Alkaline Phosphatase 10/15/2014 61  38 - 126 U/L Final  .  Total Bilirubin 10/15/2014 0.4  0.3 - 1.2 mg/dL Final  . GFR calc non Af Amer 10/15/2014 >60  >60 mL/min Final  . GFR calc Af Amer 10/15/2014 >60  >60 mL/min Final   Comment: (NOTE) The eGFR has been calculated using the CKD EPI equation. This calculation has not been validated in all clinical situations. eGFR's persistently <60 mL/min signify possible Chronic Kidney Disease.   . Anion gap 10/15/2014 5  5 - 15 Final   Performed at [facility]  . Magnesium 10/15/2014 2.1  1.7 - 2.4 mg/dL Final   Performed at [facility]    Lab Results  Component Value Date   LABCA2 13.3 04/23/2014   No results found for: CA199 No results found for: CEA No results found for: PSA No results found for: CA125   STUDIES: No results found.  ASSESSMENT: 1.  Carcinoma of breast (right) from clinical examination as well as reviewing CT scan it appears that patient has excellent response.  Will continue maintenance Herceptin therapy Possibility of local therapy is going to be discussed 2.  Carcinoma of stomach.  Repeat endoscopy revealed significant response biopsy was negative.  Again possibility of local therapy would be discussed.  MEDICAL DECISION MAKING:  All the x-rays were reviewed.  Findings were reviewed with the patient.  X-rays were reviewed independently.  Case was discussed in tumor conference.  Possibility of partial  gastrectomy and metastectomy was discussed.  Patient does have liver metastases either from breast or stomach but has responded very well.  Value of local therapy is questionable at present time and continue Herceptin maintenance therapy without doing any significant surgical intervention remains an option.  Patient will be referred back to Dr. Pat Patrick and I will discuss patient with Dr. Pat Patrick  Patient expressed understanding and was in agreement with this plan. She also understands that She can call clinic at any time with any questions, concerns, or complaints.    Breast cancer   Staging form: Breast, AJCC 7th Edition     Clinical: Stage IV (T2, N1, M1) - Signed by Forest Gleason, MD on 10/19/2014 Cancer of body of stomach   Staging form: Stomach, AJCC 7th Edition     Clinical: Stage IIA (T3, N0, M0) - Marni Griffon, MD   10/19/2014 2:35 PM

## 2014-10-30 ENCOUNTER — Telehealth: Payer: Self-pay | Admitting: *Deleted

## 2014-10-31 NOTE — Telephone Encounter (Signed)
Advised that [place] has requested an itemized statement and will mail when she receives it

## 2014-11-05 ENCOUNTER — Inpatient Hospital Stay (HOSPITAL_BASED_OUTPATIENT_CLINIC_OR_DEPARTMENT_OTHER): Payer: BLUE CROSS/BLUE SHIELD | Admitting: Oncology

## 2014-11-05 ENCOUNTER — Inpatient Hospital Stay: Payer: BLUE CROSS/BLUE SHIELD

## 2014-11-05 ENCOUNTER — Encounter: Payer: Self-pay | Admitting: Oncology

## 2014-11-05 VITALS — BP 131/81 | HR 55 | Temp 96.5°F | Wt 111.1 lb

## 2014-11-05 DIAGNOSIS — C50911 Malignant neoplasm of unspecified site of right female breast: Secondary | ICD-10-CM

## 2014-11-05 DIAGNOSIS — R531 Weakness: Secondary | ICD-10-CM

## 2014-11-05 DIAGNOSIS — C169 Malignant neoplasm of stomach, unspecified: Secondary | ICD-10-CM | POA: Diagnosis not present

## 2014-11-05 DIAGNOSIS — R5383 Other fatigue: Secondary | ICD-10-CM

## 2014-11-05 DIAGNOSIS — Z79899 Other long term (current) drug therapy: Secondary | ICD-10-CM

## 2014-11-05 DIAGNOSIS — Z171 Estrogen receptor negative status [ER-]: Secondary | ICD-10-CM

## 2014-11-05 DIAGNOSIS — C778 Secondary and unspecified malignant neoplasm of lymph nodes of multiple regions: Secondary | ICD-10-CM | POA: Diagnosis not present

## 2014-11-05 DIAGNOSIS — C50411 Malignant neoplasm of upper-outer quadrant of right female breast: Secondary | ICD-10-CM

## 2014-11-05 DIAGNOSIS — R634 Abnormal weight loss: Secondary | ICD-10-CM

## 2014-11-05 DIAGNOSIS — C162 Malignant neoplasm of body of stomach: Secondary | ICD-10-CM

## 2014-11-05 DIAGNOSIS — K769 Liver disease, unspecified: Secondary | ICD-10-CM

## 2014-11-05 DIAGNOSIS — F411 Generalized anxiety disorder: Secondary | ICD-10-CM

## 2014-11-05 DIAGNOSIS — M1990 Unspecified osteoarthritis, unspecified site: Secondary | ICD-10-CM

## 2014-11-05 DIAGNOSIS — K219 Gastro-esophageal reflux disease without esophagitis: Secondary | ICD-10-CM

## 2014-11-05 LAB — CBC WITH DIFFERENTIAL/PLATELET
BASOS PCT: 0 %
Basophils Absolute: 0 10*3/uL (ref 0–0.1)
EOS ABS: 0 10*3/uL (ref 0–0.7)
Eosinophils Relative: 1 %
HCT: 32 % — ABNORMAL LOW (ref 35.0–47.0)
HEMOGLOBIN: 10.6 g/dL — AB (ref 12.0–16.0)
Lymphocytes Relative: 28 %
Lymphs Abs: 1.2 10*3/uL (ref 1.0–3.6)
MCH: 29.9 pg (ref 26.0–34.0)
MCHC: 33.1 g/dL (ref 32.0–36.0)
MCV: 90.1 fL (ref 80.0–100.0)
Monocytes Absolute: 0.4 10*3/uL (ref 0.2–0.9)
Monocytes Relative: 9 %
NEUTROS ABS: 2.6 10*3/uL (ref 1.4–6.5)
NEUTROS PCT: 62 %
PLATELETS: 162 10*3/uL (ref 150–440)
RBC: 3.55 MIL/uL — ABNORMAL LOW (ref 3.80–5.20)
RDW: 15.3 % — AB (ref 11.5–14.5)
WBC: 4.1 10*3/uL (ref 3.6–11.0)

## 2014-11-05 LAB — COMPREHENSIVE METABOLIC PANEL
ALBUMIN: 3.6 g/dL (ref 3.5–5.0)
ALT: 12 U/L — AB (ref 14–54)
ANION GAP: 5 (ref 5–15)
AST: 16 U/L (ref 15–41)
Alkaline Phosphatase: 66 U/L (ref 38–126)
BUN: 18 mg/dL (ref 6–20)
CO2: 28 mmol/L (ref 22–32)
Calcium: 7.8 mg/dL — ABNORMAL LOW (ref 8.9–10.3)
Chloride: 99 mmol/L — ABNORMAL LOW (ref 101–111)
Creatinine, Ser: 0.68 mg/dL (ref 0.44–1.00)
GFR calc Af Amer: >60 mL/min (ref >60)
GFR calc non Af Amer: >60 mL/min (ref >60)
Glucose, Bld: 90 mg/dL (ref 65–99)
Potassium: 3.9 mmol/L (ref 3.5–5.1)
SODIUM: 132 mmol/L — AB (ref 135–145)
TOTAL PROTEIN: 6.7 g/dL (ref 6.5–8.1)
Total Bilirubin: 0.3 mg/dL (ref 0.3–1.2)

## 2014-11-05 MED ORDER — HEPARIN SOD (PORK) LOCK FLUSH 100 UNIT/ML IV SOLN
500.0000 [IU] | Freq: Once | INTRAVENOUS | Status: AC | PRN
  Administered 2014-11-05: 500 [IU]
  Filled 2014-11-05: qty 5

## 2014-11-05 MED ORDER — ACETAMINOPHEN 325 MG PO TABS
650.0000 mg | ORAL_TABLET | Freq: Once | ORAL | Status: AC
  Administered 2014-11-05: 650 mg via ORAL
  Filled 2014-11-05: qty 2

## 2014-11-05 MED ORDER — ONDANSETRON 4 MG PO TBDP: 4.0000 mg | ORAL_TABLET | ORAL | Status: DC | PRN

## 2014-11-05 MED ORDER — DIPHENHYDRAMINE HCL 25 MG PO CAPS
50.0000 mg | ORAL_CAPSULE | Freq: Once | ORAL | Status: AC
  Administered 2014-11-05: 50 mg via ORAL
  Filled 2014-11-05: qty 2

## 2014-11-05 MED ORDER — TRASTUZUMAB CHEMO INJECTION 440 MG
6.0000 mg/kg | Freq: Once | INTRAVENOUS | Status: AC
  Administered 2014-11-05: 315 mg via INTRAVENOUS
  Filled 2014-11-05: qty 15

## 2014-11-05 MED ORDER — CITALOPRAM HYDROBROMIDE 20 MG PO TABS: 20.0000 mg | ORAL_TABLET | Freq: Every day | ORAL | Status: DC

## 2014-11-06 ENCOUNTER — Encounter: Payer: Self-pay | Admitting: Oncology

## 2014-11-06 NOTE — Progress Notes (Signed)
[place] @ [facility] Telephone:(336) [phone]  Fax:(336) [place] OB: 29-Apr-1951  MR#: [id number]  XEN#:[id number]  Patient Care Team: Sofie Hartigan, MD as PCP - General (Family Medicine) Dia Crawford III, MD (Surgery)  CHIEF COMPLAINT:  Chief Complaint  Patient presents with  . Follow-up    states is feeling well. surgery planned for Monday 6/6.     Oncology History   Subjective: Chief Complaint/Diagnosis:   1. Carcinoma of breast right upper quadrant  (4 cm tumor mass) 3 positive lymph node on needle biopsy estrogen receptor progesterone receptor negative.  HER-2/neu receptor positiveby IHC 3+ Diagnosis November, 2015, T2 N1 M0 tumor stage II disease 2. Muga SCAN OF THE HEART EJECTION FRACTION 66% 3. Abnormal PET scan suggesting a mass in the stomach, and liver metastases 4. Upper endoscopy given fungating mass biopsies consistent with stomach primary cancer, HER-2/neu by IHC 2+ and by fish is 1.86. 5. Liver biopsies negative for any malignancy. 6. BRCA 2 sequencing deleterious mutation in 2014 ins As mentioned above. 7. ENDOSCOPY ULTRASOUND SHOWS STOMACH TUMOR TO BE t3 n0 m0 8.  Has finished total 6 cycles of chemotherapy with Taxotere, carboplatinum, Herceptin with overall good response in the breast as well as, cancer (April of 2016) on maintenance Herceptin therapy from May of 2016     Breast cancer   10/15/2014 Initial Diagnosis Breast cancer    Oncology Flowsheet 10/15/2014 11/05/2014  diphenhydrAMINE (BENADRYL) PO 50 mg 50 mg  trastuzumab (HERCEPTIN) IV 6 mg/kg 6 mg/kg    INTERVAL HISTORY: 64 year old lady who went upper endoscopy done revealed the stomach tumor to be decrease in size biopsy was negative.  Appetite is improving patient continues to be very apprehensive.  Here to initiate maintenance Herceptin therapy.  No chills.  No fever.  No nausea or vomiting Nov 05, 2014 Patient is here for ongoing evaluation and continuation of  Herceptin therapy.  Patient is scheduled for partial gastrectomy on Monday.  No chills.  No fever.  Feeling weak and tired.  Somewhat depressed because of her mother's that recently.  REVIEW OF SYSTEMS:   Gen. status: Patient is feeling weak and tired has lost weight.  Continues to work.  Performance status remains fairly good of 0.1.  HEENT: Alopecia.  No difficulty swallowing.  Lungs: Shortness of breath on exertion.  No cough.  Cardiac: No chest pain.  GI: No nausea no vomiting appetite is gradually improving.  No rectal bleeding.  Recent upper endoscopy.  GU: No dysuria hematuria musculoskeletal cystine no bony pains no joint swelling skin: No rashes neurological system no headache no dizziness no tingling numbness  As per HPI. Otherwise, a complete review of systems is negatve.  PAST MEDICAL HISTORY: Past Medical History  Diagnosis Date  . Breast cancer   . Arthritis   . Asthma   . GERD (gastroesophageal reflux disease)   . Stomach cancer     PAST SURGICAL HISTORY: Past Surgical History  Procedure Laterality Date  . Cholecystectomy    . Abdominal hysterectomy      FAMILY HISTORY  Significant History/PMH:   Arthritis:    Glaucoma:    Breast Cancer:    GERD - Esophageal Reflux:    Colonoscopy:    Hysterectomy:   Preventive Screening:  Has patient had any of the following test? Colonscopy  Mammography  Pap Smear (1)   Last Colonoscopy: 2010(1)   Last Mammography: 2015(1)   Last Pap Smear: 2012-hysterectomy in 1970's(1)  Smoking History: Smoking History Cigarettes per day and smoked for 25 years less than a half pack per day; quit 17 years ago(1)  PFSH: Comments: mother had a breast cancer when she was 73 years old.mother sister had breast cancer.  Mother also has stomach cancer  Social History: negative alcohol, negative tobacco  Additional Past Medical and Surgical History: gastroesophageal reflux disease  Arthritis  Hysterectomy.  Ovaries were not  removed           ADVANCED DIRECTIVES:    HEALTH MAINTENANCE: History  Substance Use Topics  . Smoking status: Never Smoker   . Smokeless tobacco: Not on file  . Alcohol Use: Not on file      Allergies  Allergen Reactions  . Codeine Other (See Comments) and Nausea Only    GI distress    Current Outpatient Prescriptions  Medication Sig Dispense Refill  . acetaminophen (TYLENOL) 500 MG tablet Take 500 mg by mouth every 6 (six) hours as needed for moderate pain or headache.    . ALPRAZolam (XANAX) 0.25 MG tablet Take 0.25 mg by mouth 2 (two) times daily at 8 am and 10 pm.    . brimonidine-timolol (COMBIGAN) 0.2-0.5 % ophthalmic solution Place 1 drop into the left eye 2 (two) times daily.     Marland Kitchen docusate sodium (COLACE) 100 MG capsule Take 100 mg by mouth as needed for mild constipation.    Marland Kitchen LORazepam (ATIVAN) 0.5 MG tablet Take 0.5 mg by mouth at bedtime as needed for anxiety.    . metoCLOPramide (REGLAN) 10 MG tablet Take 1 tablet (10 mg total) by mouth 2 (two) times daily. 60 tablet 3  . omeprazole (PRILOSEC) 40 MG capsule Take 40 mg by mouth 2 (two) times daily.     . ondansetron (ZOFRAN-ODT) 4 MG disintegrating tablet Take 1 tablet (4 mg total) by mouth every 4 (four) hours as needed for nausea. 30 tablet 3  . ranitidine (ZANTAC) 150 MG capsule Take 1 capsule (150 mg total) by mouth 2 (two) times daily. 180 capsule 3  . citalopram (CELEXA) 20 MG tablet Take 1 tablet (20 mg total) by mouth daily. 30 tablet 3   No current facility-administered medications for this visit.    OBJECTIVE:  Filed Vitals:   11/05/14 1506  BP: 131/81  Pulse: 55  Temp: 96.5 F (35.8 C)     Body mass index is 22.4 kg/(m^2).    ECOG FS:1 - Symptomatic but completely ambulatory  PHYSICAL EXAM: Gen. status: Patient is alert oriented not any acute distress slightly apprehensive.  Lymphatic system: No palpable supraclavicular cervical or axillary lymphadenopathy examination of breast: Right  breast no palpable mass.  Left breast we have masses.  Lungs: Air entry on both sides of dictation (overalls.  Cardiac: Soft systolic murmur.  Abdomen: Soft liver nonpalpable no ascites.  Spleen is not palpable.  Bowel sounds are present.  Lower extremity no swelling.  Neurological system: Heart functions within normal limit cranial nerves are intact no localizing sign skin: No ecchymosis no rash all other system has been reviewed and examined and they are within normal limit   LAB RESULTS:  Infusion on 11/05/2014  Component Date Value Ref Range Status  . WBC 11/05/2014 4.1  3.6 - 11.0 K/uL Final   A-LINE DRAW  . RBC 11/05/2014 3.55* 3.80 - 5.20 MIL/uL Final  . Hemoglobin 11/05/2014 10.6* 12.0 - 16.0 g/dL Final  . HCT 11/05/2014 32.0* 35.0 - 47.0 % Final  . MCV 11/05/2014 90.1  80.0 - 100.0 fL Final  . MCH 11/05/2014 29.9  26.0 - 34.0 pg Final  . MCHC 11/05/2014 33.1  32.0 - 36.0 g/dL Final  . RDW 11/05/2014 15.3* 11.5 - 14.5 % Final  . Platelets 11/05/2014 162  150 - 440 K/uL Final  . Neutrophils Relative % 11/05/2014 62   Final  . Neutro Abs 11/05/2014 2.6  1.4 - 6.5 K/uL Final  . Lymphocytes Relative 11/05/2014 28   Final  . Lymphs Abs 11/05/2014 1.2  1.0 - 3.6 K/uL Final  . Monocytes Relative 11/05/2014 9   Final  . Monocytes Absolute 11/05/2014 0.4  0.2 - 0.9 K/uL Final  . Eosinophils Relative 11/05/2014 1   Final  . Eosinophils Absolute 11/05/2014 0.0  0 - 0.7 K/uL Final  . Basophils Relative 11/05/2014 0   Final  . Basophils Absolute 11/05/2014 0.0  0 - 0.1 K/uL Final  . Sodium 11/05/2014 132* 135 - 145 mmol/L Final  . Potassium 11/05/2014 3.9  3.5 - 5.1 mmol/L Final  . Chloride 11/05/2014 99* 101 - 111 mmol/L Final  . CO2 11/05/2014 28  22 - 32 mmol/L Final  . Glucose, Bld 11/05/2014 90  65 - 99 mg/dL Final  . BUN 11/05/2014 18  6 - 20 mg/dL Final  . Creatinine, Ser 11/05/2014 0.68  0.44 - 1.00 mg/dL Final  . Calcium 11/05/2014 7.8* 8.9 - 10.3 mg/dL Final  . Total Protein  11/05/2014 6.7  6.5 - 8.1 g/dL Final  . Albumin 11/05/2014 3.6  3.5 - 5.0 g/dL Final  . AST 11/05/2014 16  15 - 41 U/L Final  . ALT 11/05/2014 12* 14 - 54 U/L Final  . Alkaline Phosphatase 11/05/2014 66  38 - 126 U/L Final  . Total Bilirubin 11/05/2014 0.3  0.3 - 1.2 mg/dL Final  . GFR calc non Af Amer 11/05/2014 >60  >60 mL/min Final  . GFR calc Af Amer 11/05/2014 >60  >60 mL/min Final   Comment: (NOTE) The eGFR has been calculated using the CKD EPI equation. This calculation has not been validated in all clinical situations. eGFR's persistently <60 mL/min signify possible Chronic Kidney Disease.   . Anion gap 11/05/2014 5  5 - 15 Final    Lab Results  Component Value Date   LABCA2 13.3 04/23/2014   No results found for: CA199 No results found for: CEA No results found for: PSA No results found for: CA125   STUDIES: No results found.  ASSESSMENT: 1.  Carcinoma of breast (right) from clinical examination as well as reviewing CT scan it appears that patient has excellent response.  Will continue maintenance Herceptin therapy Possibility of local therapy is going to be discussed 2.  Carcinoma of stomach.  Repeat endoscopy revealed significant response biopsy was negative.  Again possibility of local therapy would be discussed.  MEDICAL DECISION MAKING:  Patient is going for partial gastrectomy on Monday I do not see any major contraindication of continuing Herceptin and proceeding with surgery.   Patient expressed understanding and was in agreement with this plan. She also understands that She can call clinic at any time with any questions, concerns, or complaints.    Breast cancer   Staging form: Breast, AJCC 7th Edition     Clinical: Stage IV (T2, N1, M1) - Signed by Forest Gleason, MD on 10/19/2014 Cancer of body of stomach   Staging form: Stomach, AJCC 7th Edition     Clinical: Stage IIA (T3, N0, M0) - Unsigned   Jakoby Melendrez,  MD   11/06/2014 10:03 PM

## 2014-11-07 ENCOUNTER — Encounter
Admission: RE | Admit: 2014-11-07 | Discharge: 2014-11-07 | Disposition: A | Discharge status: Home / Self Care | Payer: BLUE CROSS/BLUE SHIELD | Source: Ambulatory Visit | Attending: Surgery | Admitting: Surgery

## 2014-11-07 DIAGNOSIS — C169 Malignant neoplasm of stomach, unspecified: Secondary | ICD-10-CM | POA: Diagnosis not present

## 2014-11-07 DIAGNOSIS — Z01812 Encounter for preprocedural laboratory examination: Secondary | ICD-10-CM | POA: Diagnosis not present

## 2014-11-07 DIAGNOSIS — Z01810 Encounter for preprocedural cardiovascular examination: Secondary | ICD-10-CM | POA: Diagnosis present

## 2014-11-07 HISTORY — DX: Unspecified glaucoma: H40.9

## 2014-11-07 HISTORY — DX: Family history of other specified conditions: Z84.89

## 2014-11-07 LAB — PROTIME-INR
INR: 0.99
PROTHROMBIN TIME: 13.3 s (ref 11.4–15.0)

## 2014-11-07 LAB — CBC
HCT: 31.8 % — ABNORMAL LOW (ref 35.0–47.0)
HEMOGLOBIN: 10.2 g/dL — AB (ref 12.0–16.0)
MCH: 29.3 pg (ref 26.0–34.0)
MCHC: 32.1 g/dL (ref 32.0–36.0)
MCV: 91.3 fL (ref 80.0–100.0)
Platelets: 152 10*3/uL (ref 150–440)
RBC: 3.49 MIL/uL — AB (ref 3.80–5.20)
RDW: 15.2 % — ABNORMAL HIGH (ref 11.5–14.5)
WBC: 4 10*3/uL (ref 3.6–11.0)

## 2014-11-07 LAB — HEPATIC FUNCTION PANEL
ALT: 13 U/L — ABNORMAL LOW (ref 14–54)
AST: 17 U/L (ref 15–41)
Albumin: 3.4 g/dL — ABNORMAL LOW (ref 3.5–5.0)
Alkaline Phosphatase: 62 U/L (ref 38–126)
Bilirubin, Direct: <0.1 mg/dL — ABNORMAL LOW (ref 0.1–0.5)
Total Bilirubin: 0.2 mg/dL — ABNORMAL LOW (ref 0.3–1.2)
Total Protein: 6.7 g/dL (ref 6.5–8.1)

## 2014-11-07 LAB — BASIC METABOLIC PANEL
ANION GAP: 4 — AB (ref 5–15)
BUN: 15 mg/dL (ref 6–20)
CHLORIDE: 102 mmol/L (ref 101–111)
CO2: 30 mmol/L (ref 22–32)
Calcium: 8.3 mg/dL — ABNORMAL LOW (ref 8.9–10.3)
Creatinine, Ser: 0.79 mg/dL (ref 0.44–1.00)
GFR calc Af Amer: >60 mL/min (ref >60)
GFR calc non Af Amer: >60 mL/min (ref >60)
Glucose, Bld: 111 mg/dL — ABNORMAL HIGH (ref 65–99)
POTASSIUM: 4.4 mmol/L (ref 3.5–5.1)
Sodium: 136 mmol/L (ref 135–145)

## 2014-11-07 LAB — APTT: aPTT: 28 seconds (ref 24–36)

## 2014-11-07 LAB — DIFFERENTIAL
BASOS ABS: 0 10*3/uL (ref 0–0.1)
Basophils Relative: 0 %
EOS PCT: 1 %
Eosinophils Absolute: 0 10*3/uL (ref 0–0.7)
LYMPHS PCT: 25 %
Lymphs Abs: 1 10*3/uL (ref 1.0–3.6)
MONO ABS: 0.4 10*3/uL (ref 0.2–0.9)
MONOS PCT: 10 %
NEUTROS PCT: 64 %
Neutro Abs: 2.6 10*3/uL (ref 1.4–6.5)

## 2014-11-07 LAB — ABO/RH: ABO/RH(D): A POS

## 2014-11-07 NOTE — Patient Instructions (Addendum)
  Your procedure is scheduled on: Monday 11/11/14 Report to Day Surgery. Medical Mall Entrance To find out your arrival time please call [phone] between 1PM - 3PM on Friday 11/08/14.  Remember: Instructions that are not followed completely may result in serious medical risk, up to and including death, or upon the discretion of your surgeon and anesthesiologist your surgery may need to be rescheduled.    __x__ 1. Do not eat food or drink liquids after midnight. No gum chewing or hard candies.     __x__ 2. No Alcohol for 24 hours before or after surgery.   ____ 3. Bring all medications with you on the day of surgery if instructed.    __x__ 4. Notify your doctor if there is any change in your medical condition     (cold, fever, infections).     Do not wear jewelry, make-up, hairpins, clips or nail polish.  Do not wear lotions, powders, or perfumes. You may wear deodorant.  Do not shave 48 hours prior to surgery. Men may shave face and neck.  Do not bring valuables to the hospital.    [facility] is not responsible for any belongings or valuables.               Contacts, dentures or bridgework may not be worn into surgery.  Leave your suitcase in the car. After surgery it may be brought to your room.  For patients admitted to the hospital, discharge time is determined by your                treatment team.   Patients discharged the day of surgery will not be allowed to drive home.   Please read over the following fact sheets that you were given:   Surgical Site Infection Prevention   __x__ Take these medicines the morning of surgery with A SIP OF WATER:    1. omeprazole  2. metoclopramide  3. ranitadine  4. alprazolam  5.  6.  ____ Fleet Enema (as directed)   ____ Use CHG Soap as directed  ____ Use inhalers on the day of surgery  ____ Stop metformin 2 days prior to surgery    ____ Take 1/2 of usual insulin dose the night before surgery and none on the morning of surgery.    ____ Stop Coumadin/Plavix/aspirin on   ____ Stop Anti-inflammatories on    ____ Stop supplements until after surgery.    ____ Bring C-Pap to the hospital.

## 2014-11-08 LAB — CEA: CEA: 2 ng/mL (ref 0.0–4.7)

## 2014-11-08 LAB — PREPARE RBC (CROSSMATCH)

## 2014-11-11 ENCOUNTER — Ambulatory Visit: Payer: BLUE CROSS/BLUE SHIELD | Admitting: Certified Registered Nurse Anesthetist

## 2014-11-11 ENCOUNTER — Inpatient Hospital Stay
Admission: AD | Admit: 2014-11-11 | Discharge: 2014-11-17 | DRG: 327 | Disposition: A | Discharge status: Home / Self Care | Payer: BLUE CROSS/BLUE SHIELD | Source: Ambulatory Visit | Attending: Surgery | Admitting: Surgery

## 2014-11-11 ENCOUNTER — Encounter: Payer: Self-pay | Admitting: *Deleted

## 2014-11-11 ENCOUNTER — Encounter
Admission: AD | Disposition: A | Discharge status: Home / Self Care | Payer: Self-pay | Source: Ambulatory Visit | Attending: Surgery

## 2014-11-11 DIAGNOSIS — Z800 Family history of malignant neoplasm of digestive organs: Secondary | ICD-10-CM

## 2014-11-11 DIAGNOSIS — C169 Malignant neoplasm of stomach, unspecified: Secondary | ICD-10-CM | POA: Diagnosis present

## 2014-11-11 DIAGNOSIS — K259 Gastric ulcer, unspecified as acute or chronic, without hemorrhage or perforation: Secondary | ICD-10-CM | POA: Diagnosis present

## 2014-11-11 DIAGNOSIS — C787 Secondary malignant neoplasm of liver and intrahepatic bile duct: Secondary | ICD-10-CM | POA: Diagnosis present

## 2014-11-11 DIAGNOSIS — M1990 Unspecified osteoarthritis, unspecified site: Secondary | ICD-10-CM | POA: Diagnosis present

## 2014-11-11 DIAGNOSIS — K219 Gastro-esophageal reflux disease without esophagitis: Secondary | ICD-10-CM | POA: Diagnosis present

## 2014-11-11 DIAGNOSIS — Z87891 Personal history of nicotine dependence: Secondary | ICD-10-CM

## 2014-11-11 DIAGNOSIS — Z8249 Family history of ischemic heart disease and other diseases of the circulatory system: Secondary | ICD-10-CM | POA: Diagnosis not present

## 2014-11-11 DIAGNOSIS — H409 Unspecified glaucoma: Secondary | ICD-10-CM | POA: Diagnosis present

## 2014-11-11 DIAGNOSIS — Z79899 Other long term (current) drug therapy: Secondary | ICD-10-CM | POA: Diagnosis not present

## 2014-11-11 DIAGNOSIS — Z90710 Acquired absence of both cervix and uterus: Secondary | ICD-10-CM

## 2014-11-11 DIAGNOSIS — Z803 Family history of malignant neoplasm of breast: Secondary | ICD-10-CM | POA: Diagnosis not present

## 2014-11-11 HISTORY — PX: GASTRECTOMY: SHX58

## 2014-11-11 LAB — CBC
HEMATOCRIT: 33.6 % — AB (ref 35.0–47.0)
Hemoglobin: 10.8 g/dL — ABNORMAL LOW (ref 12.0–16.0)
MCH: 29.2 pg (ref 26.0–34.0)
MCHC: 32.1 g/dL (ref 32.0–36.0)
MCV: 91.2 fL (ref 80.0–100.0)
Platelets: 154 10*3/uL (ref 150–440)
RBC: 3.69 MIL/uL — ABNORMAL LOW (ref 3.80–5.20)
RDW: 15.3 % — ABNORMAL HIGH (ref 11.5–14.5)
WBC: 8.5 10*3/uL (ref 3.6–11.0)

## 2014-11-11 LAB — CREATININE, SERUM
Creatinine, Ser: 0.7 mg/dL (ref 0.44–1.00)
GFR calc Af Amer: >60 mL/min (ref >60)

## 2014-11-11 SURGERY — GASTRECTOMY, TOTAL
Anesthesia: General | Wound class: Clean Contaminated

## 2014-11-11 MED ORDER — FENTANYL CITRATE (PF) 100 MCG/2ML IJ SOLN: 25.0000 ug | INTRAMUSCULAR | Status: DC | PRN

## 2014-11-11 MED ORDER — TIMOLOL MALEATE 0.5 % OP SOLN
1.0000 [drp] | Freq: Two times a day (BID) | OPHTHALMIC | Status: DC
  Filled 2014-11-11: qty 5

## 2014-11-11 MED ORDER — 0.9 % SODIUM CHLORIDE (POUR BTL) OPTIME
TOPICAL | Status: DC | PRN
  Administered 2014-11-11: 700 mL

## 2014-11-11 MED ORDER — PROMETHAZINE HCL 25 MG/ML IJ SOLN
25.0000 mg | Freq: Four times a day (QID) | INTRAMUSCULAR | Status: DC | PRN
  Administered 2014-11-12: 25 mg via INTRAVENOUS
  Filled 2014-11-11: qty 1

## 2014-11-11 MED ORDER — EPHEDRINE SULFATE 50 MG/ML IJ SOLN: INTRAMUSCULAR | Status: DC | PRN

## 2014-11-11 MED ORDER — LIDOCAINE HCL (CARDIAC) 20 MG/ML IV SOLN
INTRAVENOUS | Status: DC | PRN
  Administered 2014-11-11: 80 mg via INTRAVENOUS

## 2014-11-11 MED ORDER — GLYCOPYRROLATE 0.2 MG/ML IJ SOLN
INTRAMUSCULAR | Status: DC | PRN
  Administered 2014-11-11: 0.6 mg via INTRAVENOUS
  Administered 2014-11-11: 0.2 mg via INTRAVENOUS

## 2014-11-11 MED ORDER — CEFAZOLIN SODIUM-DEXTROSE 2-3 GM-% IV SOLR
INTRAVENOUS | Status: AC
Start: 2014-11-11 — End: 2014-11-11
  Administered 2014-11-11: 2 g via INTRAVENOUS
  Filled 2014-11-11: qty 50

## 2014-11-11 MED ORDER — EPHEDRINE SULFATE 50 MG/ML IJ SOLN
INTRAMUSCULAR | Status: DC | PRN
  Administered 2014-11-11: 10 mg via INTRAVENOUS

## 2014-11-11 MED ORDER — ACETAMINOPHEN 650 MG RE SUPP: 650.0000 mg | RECTAL | Status: DC | PRN

## 2014-11-11 MED ORDER — LORAZEPAM BOLUS VIA INFUSION
0.5000 mg | Freq: Two times a day (BID) | INTRAVENOUS | Status: DC | PRN
  Filled 2014-11-11: qty 1

## 2014-11-11 MED ORDER — ROCURONIUM BROMIDE 100 MG/10ML IV SOLN
INTRAVENOUS | Status: DC | PRN
  Administered 2014-11-11: 15 mg via INTRAVENOUS
  Administered 2014-11-11 (×2): 5 mg via INTRAVENOUS
  Administered 2014-11-11 (×2): 10 mg via INTRAVENOUS
  Administered 2014-11-11: 25 mg via INTRAVENOUS
  Administered 2014-11-11: 5 mg via INTRAVENOUS
  Administered 2014-11-11: 10 mg via INTRAVENOUS
  Administered 2014-11-11: 5 mg via INTRAVENOUS
  Administered 2014-11-11: 10 mg via INTRAVENOUS

## 2014-11-11 MED ORDER — HYDROMORPHONE HCL 1 MG/ML IJ SOLN
INTRAMUSCULAR | Status: AC
  Administered 2014-11-11: 0.25 mg via INTRAVENOUS
  Filled 2014-11-11: qty 1

## 2014-11-11 MED ORDER — CEFAZOLIN SODIUM-DEXTROSE 2-3 GM-% IV SOLR
INTRAVENOUS | Status: DC | PRN
  Administered 2014-11-11: 2 g via INTRAVENOUS

## 2014-11-11 MED ORDER — BRIMONIDINE TARTRATE 0.2 % OP SOLN
1.0000 [drp] | Freq: Two times a day (BID) | OPHTHALMIC | Status: DC
  Administered 2014-11-11 – 2014-11-17 (×12): 1 [drp] via OPHTHALMIC
  Filled 2014-11-11 (×2): qty 5

## 2014-11-11 MED ORDER — KCL IN DEXTROSE-NACL 20-5-0.45 MEQ/L-%-% IV SOLN
INTRAVENOUS | Status: DC
  Administered 2014-11-11 – 2014-11-12 (×3): via INTRAVENOUS
  Administered 2014-11-12: 1000 mL via INTRAVENOUS
  Administered 2014-11-13: 20:00:00 via INTRAVENOUS
  Administered 2014-11-13: 1 mL via INTRAVENOUS
  Administered 2014-11-13: 11:00:00 via INTRAVENOUS
  Administered 2014-11-14: 1 mL via INTRAVENOUS
  Administered 2014-11-14 – 2014-11-15 (×2): via INTRAVENOUS
  Administered 2014-11-15: 1 mL via INTRAVENOUS
  Administered 2014-11-16 – 2014-11-17 (×3): via INTRAVENOUS
  Filled 2014-11-11 (×17): qty 1000

## 2014-11-11 MED ORDER — ENOXAPARIN SODIUM 40 MG/0.4ML ~~LOC~~ SOLN
40.0000 mg | SUBCUTANEOUS | Status: DC
  Administered 2014-11-12 – 2014-11-17 (×6): 40 mg via SUBCUTANEOUS
  Filled 2014-11-11 (×6): qty 0.4

## 2014-11-11 MED ORDER — FAMOTIDINE IN NACL 20-0.9 MG/50ML-% IV SOLN
20.0000 mg | Freq: Two times a day (BID) | INTRAVENOUS | Status: DC
  Administered 2014-11-11 – 2014-11-16 (×11): 20 mg via INTRAVENOUS
  Filled 2014-11-11 (×13): qty 50

## 2014-11-11 MED ORDER — LACTATED RINGERS IV SOLN
INTRAVENOUS | Status: DC
  Administered 2014-11-11 (×2): via INTRAVENOUS

## 2014-11-11 MED ORDER — PHENYLEPHRINE HCL 10 MG/ML IJ SOLN
INTRAMUSCULAR | Status: DC | PRN
  Administered 2014-11-11: 100 ug via INTRAVENOUS

## 2014-11-11 MED ORDER — ONDANSETRON HCL 4 MG/2ML IJ SOLN: 4.0000 mg | Freq: Once | INTRAMUSCULAR | Status: DC | PRN

## 2014-11-11 MED ORDER — FENTANYL CITRATE (PF) 100 MCG/2ML IJ SOLN
INTRAMUSCULAR | Status: DC | PRN
  Administered 2014-11-11: 50 ug via INTRAVENOUS
  Administered 2014-11-11: 25 ug via INTRAVENOUS
  Administered 2014-11-11 (×3): 50 ug via INTRAVENOUS
  Administered 2014-11-11: 25 ug via INTRAVENOUS

## 2014-11-11 MED ORDER — NEOSTIGMINE METHYLSULFATE 10 MG/10ML IV SOLN
INTRAVENOUS | Status: DC | PRN
  Administered 2014-11-11: 3 mg via INTRAVENOUS

## 2014-11-11 MED ORDER — MIDAZOLAM HCL 2 MG/2ML IJ SOLN
INTRAMUSCULAR | Status: DC | PRN
  Administered 2014-11-11: 2 mg via INTRAVENOUS

## 2014-11-11 MED ORDER — BRIMONIDINE TARTRATE-TIMOLOL 0.2-0.5 % OP SOLN: 1.0000 [drp] | Freq: Two times a day (BID) | OPHTHALMIC | Status: DC

## 2014-11-11 MED ORDER — HYDROMORPHONE HCL 1 MG/ML IJ SOLN
0.2500 mg | INTRAMUSCULAR | Status: DC | PRN
  Administered 2014-11-11 (×8): 0.25 mg via INTRAVENOUS

## 2014-11-11 MED ORDER — PHENOL 1.4 % MT LIQD
1.0000 | OROMUCOSAL | Status: DC | PRN
  Administered 2014-11-13: 1 via OROMUCOSAL
  Filled 2014-11-11: qty 177

## 2014-11-11 MED ORDER — CEFAZOLIN SODIUM-DEXTROSE 2-3 GM-% IV SOLR
2.0000 g | Freq: Once | INTRAVENOUS | Status: AC
  Administered 2014-11-11: 2 g via INTRAVENOUS

## 2014-11-11 MED ORDER — ONDANSETRON HCL 4 MG/2ML IJ SOLN
INTRAMUSCULAR | Status: DC | PRN
  Administered 2014-11-11: 4 mg via INTRAVENOUS

## 2014-11-11 MED ORDER — PROPOFOL 10 MG/ML IV BOLUS
INTRAVENOUS | Status: DC | PRN
  Administered 2014-11-11: 120 mg via INTRAVENOUS

## 2014-11-11 MED ORDER — ONDANSETRON HCL 4 MG/2ML IJ SOLN
4.0000 mg | Freq: Four times a day (QID) | INTRAMUSCULAR | Status: DC | PRN
  Administered 2014-11-11 – 2014-11-14 (×8): 4 mg via INTRAVENOUS
  Filled 2014-11-11 (×8): qty 2

## 2014-11-11 MED ORDER — CEFAZOLIN SODIUM 1-5 GM-% IV SOLN
1.0000 g | Freq: Four times a day (QID) | INTRAVENOUS | Status: AC
  Administered 2014-11-11 – 2014-11-12 (×3): 1 g via INTRAVENOUS
  Filled 2014-11-11 (×3): qty 50

## 2014-11-11 MED ORDER — PROMETHAZINE HCL 25 MG PO TABS: 25.0000 mg | ORAL_TABLET | Freq: Four times a day (QID) | ORAL | Status: DC | PRN

## 2014-11-11 MED ORDER — MORPHINE SULFATE 2 MG/ML IJ SOLN
2.0000 mg | INTRAMUSCULAR | Status: DC | PRN
  Administered 2014-11-11: 4 mg via INTRAVENOUS
  Administered 2014-11-11: 2 mg via INTRAVENOUS
  Administered 2014-11-12 – 2014-11-15 (×19): 4 mg via INTRAVENOUS
  Filled 2014-11-11 (×9): qty 2
  Filled 2014-11-11: qty 1
  Filled 2014-11-11 (×6): qty 2
  Filled 2014-11-11: qty 3
  Filled 2014-11-11: qty 2
  Filled 2014-11-11: qty 3
  Filled 2014-11-11 (×2): qty 2

## 2014-11-11 SURGICAL SUPPLY — 41 items
APPLIER CLIP 11 MED OPEN (CLIP) ×3
CANISTER SUCT 1200ML W/VALVE (MISCELLANEOUS) ×3 IMPLANT
CATH TRAY 16F METER LATEX (MISCELLANEOUS) ×3 IMPLANT
CHLORAPREP W/TINT 26ML (MISCELLANEOUS) ×3 IMPLANT
CLIP APPLIE 11 MED OPEN (CLIP) ×1 IMPLANT
DRAPE LAPAROTOMY 100X77 ABD (DRAPES) ×3 IMPLANT
DRSG TELFA 4X14 ISLAND NADH (GAUZE/BANDAGES/DRESSINGS) ×3 IMPLANT
ELECT BLADE 6.5 EXT (BLADE) ×3 IMPLANT
ELECT CAUTERY BLADE 6.4 (BLADE) ×3 IMPLANT
GEL ULTRASOUND 20GR AQUASONIC (MISCELLANEOUS) ×3 IMPLANT
GLOVE BIO SURGEON STRL SZ7.5 (GLOVE) ×3 IMPLANT
GOWN STRL REUS W/ TWL LRG LVL3 (GOWN DISPOSABLE) ×4 IMPLANT
GOWN STRL REUS W/TWL LRG LVL3 (GOWN DISPOSABLE) ×8
JACKSON PRATT 10 (INSTRUMENTS) IMPLANT
KIT RM TURNOVER STRD PROC AR (KITS) ×3 IMPLANT
LABEL OR SOLS (LABEL) IMPLANT
LAPSAC SURG PACK 8X10 (MISCELLANEOUS) ×3
NS IRRIG 1000ML POUR BTL (IV SOLUTION) ×6 IMPLANT
PACK BASIN MAJOR ARMC (MISCELLANEOUS) ×3 IMPLANT
PACK SURG LAPSAC 8X10 (MISCELLANEOUS) ×1 IMPLANT
PAD GROUND ADULT SPLIT (MISCELLANEOUS) ×3 IMPLANT
SHEARS HARMONIC STRL 23CM (MISCELLANEOUS) ×3 IMPLANT
STAPLER PROXIMATE (STAPLE) ×3 IMPLANT
STAPLER PROXIMATE 75MM BLUE (STAPLE) ×3 IMPLANT
STAPLER SKIN PROX 35W (STAPLE) ×3 IMPLANT
SUT ETHILON 2 0 FS 18 (SUTURE) IMPLANT
SUT MNCRL 3-0 UNDYED SH (SUTURE) ×2 IMPLANT
SUT MONOCRYL 3-0 UNDYED (SUTURE) ×4
SUT PDS AB 1 CT  36 (SUTURE) ×4
SUT PDS AB 1 CT 36 (SUTURE) ×2 IMPLANT
SUT SILK 2 0 (SUTURE) ×2
SUT SILK 2 0 SH (SUTURE) IMPLANT
SUT SILK 2 0 SH CR/8 (SUTURE) ×3 IMPLANT
SUT SILK 2 0SH CR/8 30 (SUTURE) ×6 IMPLANT
SUT SILK 2-0 30XBRD TIE 12 (SUTURE) ×1 IMPLANT
SUT SILK 3-0 (SUTURE) ×2
SUT SILK 3-0 SH-1 18XCR BRD (SUTURE) ×1
SUT SILK 4 0 (SUTURE)
SUT SILK 4-0 18XBRD TIE 12 (SUTURE) IMPLANT
SUTURE SILK 3-0 SH-1 18XCR BRD (SUTURE) ×1 IMPLANT
WATER STERILE IRR 1000ML POUR (IV SOLUTION) IMPLANT

## 2014-11-11 NOTE — Progress Notes (Signed)
H&P unchanged. Paper copy to be scanned into the system. The risks of surgery were again reviewed with the patient and family and they agree to proceed.

## 2014-11-11 NOTE — Plan of Care (Signed)
Problem: Discharge Progression Outcomes Goal: Complications resolved/controlled Outcome: Progressing Pt was a post op  Pt with a gastrectomy by Dr. Leanora Cover Post op vitals wnls pain controlled well with dilaudid NG tube LIS

## 2014-11-11 NOTE — Op Note (Addendum)
Operative Note  Preoperative Diagnosis: Gastric cancer  Postoperative Diagnosis: Same  Operation Performed: Antrectomy, Bilroth I gastroduodenostomy, intraoperative ultrasound of liver  Surgeon: Laverle Patter., M.D.   Assistant: Nestor Lewandowsky, M.D.  Anesthesia: General Endotracheal  Date of Procedure: 11/11/2014   Procedure in Detail:  The risks (including the possibility of adjacent organ injury, the necessity of converting to an open procedure, and the risk of postoperative infection / abscess), potential benefits, non-surgical treatment options, and expected outcomes were reviewed with the patient. The patient concurred with the proposed plan and agreed to proceed, giving informed consent.   Prior to the induction of anesthesia, antibiotic prophylaxis was administered. The patient was placed supine on the OR table and prepped and draped in the usual sterile fashion.   An upper midline incision was made and carried down through the linea alba with the electrocautery and the peritoneum was entered carefully. The round ligament was divided with the Harmonic scalpel and the falciform ligament was divided and adhesions to the visceral surface of the liver were taken down but the triangular ligaments were left intact. Manual palpation of the entire peritoneal cavity as well as visual inspection of the omentum failed to reveal any metastases. There were no peritoneal implants, the pelvis was clear, both subdiaphragmatic areas were completely clear and bimanual palpation of the liver failed to reveal any masses. A thorough and rigorous intraoperative ultrasound of the liver was performed and the only potential lesion was a 2 mm very bright hyperechoic lesion in segment 8 of the right lobe of the liver. This was fairly deep and an accessible from a percutaneous biopsy standpoint because it was so small. Therefore a generous Kocher maneuver was performed, the right gastric vessels were ligated with  2-0 silk and divided, the proximal duodenum was dissected away from the pancreas, the lesser sac was entered such that a small portion of the greater omentum was dissected off of the middle colic vessels up to an area corresponding to the incisura angularis. Here the right gastroepiploic vessel was divided area adhesions from the posterior wall of the stomach to the pancreas were divided and the lesser omentum was divided up to an area at the incisura angularis with the Harmonic scalpel such that the entire antrum was mobilized. The duodenum was divided with a GIA stapling device about 1 cm distal to the pylorus, and bimanual palpation of the stomach failed to reveal any masses. Therefore, I made a transverse gastrotomy in the proximal antrum in order to inspect the gastric mucosa. From the gastroesophageal junction down through the body, utilizing two narrow hand-held malleable retractors and good lighting I was unable to find any ulcerations within the proximal stomach. There were multiple plaque like polyps throughout the entire body of the stomach and these range from a few millimeters up to about 7 mm in diameter. These were numerous and all appeared benign. In the distal stomach there was a palpable nodule an abnormality possibly associated with a small ulcer in the prepyloric region. This was the only pathology that I felt was compatible with the patient's original gastric cancer. Since it would be resected during the antrectomy I elected not to remove any additional stomach. The gastrectomy was affected with the TA 90 stapling device and specimen passed off the table. A Billroth I and in handsewn 2 layer gastroduodenostomy was performed with the outer layer consisted of interrupted 2-0 silk seromuscular Lembert sutures and inner layer of running 3-0 Monocryl. This was performed  on the greater curvature aspect of the stomach staple line. The anastomosis was widely patent, watertight, and under no tension. No  vagotomy was performed and therefore no cholecystectomy was performed (the patient had no gallstones on intraoperative ultrasound). Peritoneum was irrigated with warm normal saline. This was suctioned clear. The linea alba was then closed with a running #1 PDS suture after the omentum had been draped over top of the small intestine in its anatomic position. The subcutaneous tissue was irrigated, hemostasis in the subcutaneous tissue was affected with the electrocautery, and the skin was approximated with a skin stapling device. A sterile dressing was applied concluding the procedure.  Findings: No evidence of disease within the peritoneum, pelvis, omentum, liver, or perigastric lymph nodes. Small prepyloric ulcer associated with a nodule that was thought to represent the patient's previous disease.          Specimens: Antrum with attached small portion of omentum.           Complications: None; the patient tolerated the procedure well.   Consuela Mimes, MD 11/11/2014

## 2014-11-11 NOTE — Plan of Care (Signed)
Problem: Discharge Progression Outcomes Goal: Tubes and drains discontinued if indicated Outcome: Progressing Foley in place removed post op day 2 NG tube in place LIS Monitor input and output of both drains closely.

## 2014-11-11 NOTE — Anesthesia Postprocedure Evaluation (Signed)
  Anesthesia Post-op Note  Patient: Kimberly Thornton  Procedure(s) Performed: Procedure(s): ANTRECTOMY, BILROUTH I, GASTRODUODENOSTOMY, IOUS OF LIVER  (N/A)  Anesthesia type:General ETT  Patient location: PACU  Post pain: Pain level controlled  Post assessment: Post-op Vital signs reviewed, Patient's Cardiovascular Status Stable, Respiratory Function Stable, Patent Airway and No signs of Nausea or vomiting  Post vital signs: Reviewed and stable  Last Vitals:  Filed Vitals:   11/11/14 1300  BP: 106/74  Pulse: 56  Temp:   Resp: 12    Level of consciousness: awake, alert  and patient cooperative  Complications: No apparent anesthesia complications

## 2014-11-11 NOTE — Anesthesia Preprocedure Evaluation (Signed)
Anesthesia Evaluation  Patient identified by MRN, date of birth, ID band Patient awake    Reviewed: Allergy & Precautions, H&P , NPO status , Patient's Chart, lab work & pertinent test results, reviewed documented beta blocker date and time   Airway Mallampati: III  TM Distance: >3 FB Neck ROM: full    Dental   Pulmonary Current Smoker, former smoker,          Cardiovascular Rate:Normal     Neuro/Psych    GI/Hepatic GERD-  ,  Endo/Other    Renal/GU      Musculoskeletal   Abdominal   Peds  Hematology   Anesthesia Other Findings   Reproductive/Obstetrics                             Anesthesia Physical Anesthesia Plan  ASA: III  Anesthesia Plan: General ETT   Post-op Pain Management:    Induction:   Airway Management Planned:   Additional Equipment:   Intra-op Plan:   Post-operative Plan:   Informed Consent: I have reviewed the patients History and Physical, chart, labs and discussed the procedure including the risks, benefits and alternatives for the proposed anesthesia with the patient or authorized representative who has indicated his/her understanding and acceptance.     Plan Discussed with: CRNA  Anesthesia Plan Comments:         Anesthesia Quick Evaluation

## 2014-11-11 NOTE — Transfer of Care (Signed)
Immediate Anesthesia Transfer of Care Note  Patient: Kimberly Thornton  Procedure(s) Performed: Procedure(s): ANTRECTOMY, BILROUTH I, GASTRODUODENOSTOMY, IOUS OF LIVER  (N/A)  Patient Location: PACU  Anesthesia Type:General  Level of Consciousness: awake, alert  Airway & Oxygen Therapy: Patient Spontanous Breathing and Patient connected to face mask oxygen  Post-op Assessment: Report to RN, VSS  Post vital signs: Reviewed and stable  Last Vitals:  Filed Vitals:   11/11/14 1147  BP: 139/76  Pulse: 53  Temp: 36.7 C  Resp: 11    Complications: No apparent anesthesia complications

## 2014-11-11 NOTE — Plan of Care (Signed)
Problem: Discharge Progression Outcomes Goal: Other Discharge Outcomes/Goals Outcome: Progressing Discharge outcome goals: Advance diet as tolerated per dr's order Monitor bowel sounds frequently and make sure pt walks postop day 2. Pain control with po pain medications as tolerated by pt. Update family as status changes per their request.

## 2014-11-12 LAB — CBC
HCT: 30.1 % — ABNORMAL LOW (ref 35.0–47.0)
Hemoglobin: 10.2 g/dL — ABNORMAL LOW (ref 12.0–16.0)
MCH: 30.6 pg (ref 26.0–34.0)
MCHC: 33.9 g/dL (ref 32.0–36.0)
MCV: 90.4 fL (ref 80.0–100.0)
Platelets: 132 10*3/uL — ABNORMAL LOW (ref 150–440)
RBC: 3.33 MIL/uL — ABNORMAL LOW (ref 3.80–5.20)
RDW: 15.4 % — AB (ref 11.5–14.5)
WBC: 5 10*3/uL (ref 3.6–11.0)

## 2014-11-12 LAB — COMPREHENSIVE METABOLIC PANEL
ALBUMIN: 2.8 g/dL — AB (ref 3.5–5.0)
ALK PHOS: 53 U/L (ref 38–126)
ALT: 13 U/L — ABNORMAL LOW (ref 14–54)
AST: 18 U/L (ref 15–41)
Anion gap: 6 (ref 5–15)
BUN: 9 mg/dL (ref 6–20)
CHLORIDE: 101 mmol/L (ref 101–111)
CO2: 30 mmol/L (ref 22–32)
Calcium: 8 mg/dL — ABNORMAL LOW (ref 8.9–10.3)
Creatinine, Ser: 0.8 mg/dL (ref 0.44–1.00)
GFR calc Af Amer: >60 mL/min (ref >60)
GLUCOSE: 142 mg/dL — AB (ref 65–99)
Potassium: 4.4 mmol/L (ref 3.5–5.1)
SODIUM: 137 mmol/L (ref 135–145)
Total Bilirubin: 0.2 mg/dL — ABNORMAL LOW (ref 0.3–1.2)
Total Protein: 5.6 g/dL — ABNORMAL LOW (ref 6.5–8.1)

## 2014-11-12 LAB — MAGNESIUM: MAGNESIUM: 1.7 mg/dL (ref 1.7–2.4)

## 2014-11-12 MED ORDER — PROMETHAZINE HCL 25 MG/ML IJ SOLN
12.5000 mg | INTRAMUSCULAR | Status: DC | PRN
  Administered 2014-11-12 – 2014-11-15 (×14): 12.5 mg via INTRAVENOUS
  Filled 2014-11-12 (×15): qty 1

## 2014-11-12 NOTE — Progress Notes (Signed)
Initial Nutrition Assessment   INTERVENTION: Medical Food Supplement Therapy: will recommend  Boost Breeze TID once diet able to be advanced as pt daughter reports pt only likes Boost Breeze and does not like milky supplement   NUTRITION DIAGNOSIS:  Inadequate oral intake related to inability to eat as evidenced by NPO status.  GOAL:   (Goal for diet advancement and tolerance as medically able within 5-7days)  MONITOR:   (Energy Intake, Electrolyte and Renal Profile, Digestive System, Anthropometrics)  REASON FOR ASSESSMENT:  Malnutrition Screening Tool    ASSESSMENT:  Pt admitted with gastric cancer s/p surgical intervention antrectomy, Bilroth I gastroduodenostomy yesterday. Pt asleep on visit. Per MD note, morphine causing nausea. PMHx:  Past Medical History  Diagnosis Date  . Breast cancer   . Arthritis   . GERD (gastroesophageal reflux disease)   . Stomach cancer   . Family history of adverse reaction to anesthesia     mother had nausea  . Glaucoma    Current Nutrition: pt NPO  Nutrition Prior to Admission: Per daughter since chemotherapy (this past March) pt po intake has been improving well. Daughter reports she fixes her meals and makes sure that the pt is able to eat. Daughter also reports pt likes Boost Breeze-[place] only, does not like or tolerate milky supplements.  Digestive system: pt with NG tube to LIS, 34mL out per Nsg documentation, last BM unknown  Medications: D5 0.45%NS with KCl at 133mL/hr (providing 408 kcals in 24 hours), Pepcid Labs: Electrolyte and Renal Profile:  Recent Labs Lab 11/05/14 1418 11/07/14 1329 11/11/14 1408 11/12/14 0505  BUN 18 15  --  9  CREATININE 0.68 0.79 0.70 0.80  NA 132* 136  --  137  K 3.9 4.4  --  4.4  MG  --   --   --  1.7   Glucose Profile: No results for input(s): GLUCAP in the last 72 hours. Protein Profile:  Recent Labs Lab 11/05/14 1418 11/07/14 1329 11/12/14 0505  ALBUMIN 3.6 3.4* 2.8*    Nutritional Anemia Profile:  CBC Latest Ref Rng 11/12/2014 11/11/2014 11/07/2014  WBC 3.6 - 11.0 K/uL 5.0 8.5 4.0  Hemoglobin 12.0 - 16.0 g/dL 10.2(L) 10.8(L) 10.2(L)  Hematocrit 35.0 - 47.0 % 30.1(L) 33.6(L) 31.8(L)  Platelets 150 - 440 K/uL 132(L) 154 152    Pt daughter reports 40-50lbs weight loss since November 2015 (26-31% weight loss in 8 months). Daughter reports weight of 111lbs at MD office outpatient last week.  Height:  Ht Readings from Last 1 Encounters:  11/11/14 4\' 11"  (1.499 m)    Weight:  Wt Readings from Last 1 Encounters:  11/11/14 111 lb (50.349 kg)    Wt Readings from Last 10 Encounters:  11/11/14 111 lb (50.349 kg)  11/05/14 111 lb 1.8 oz (50.4 kg)  10/15/14 117 lb 1 oz (53.1 kg)  09/03/14 121 lb 0.5 oz (54.899 kg)    BMI:  Body mass index is 22.41 kg/(m^2).   Unable to complete Nutrition-Focused physical exam at this time.    Estimated Nutritional Needs:  Kcal:  BEE: 945kcals, TEE: (IF 1.2-1.4)(AF 1.2) 1365-1613kcals  Protein:  56-65g protein (1.1-1.3g/kg)   Fluid:  1263-1536mL of fluid (25-6mL/kg)  Diet Order:  Diet NPO time specified Except for: Ice Chips, Sips with Meds, Other (See Comments)  EDUCATION NEEDS:  Education needs no appropriate at this time   Intake/Output Summary (Last 24 hours) at 11/12/14 1309 Last data filed at 11/12/14 0359  Gross per 24 hour  Intake  1595 ml  Output    500 ml  Net   1095 ml    HIGH Care Level  Dwyane Luo, RD, LDN Pager [phone]

## 2014-11-12 NOTE — Progress Notes (Signed)
1 Day Post-Op   Subjective:  Morphine is causing nausea, and Phenergan is knocking her out. Other than that, she is doing well. She slept some last night in her throat is not too sore.  Vital signs in last 24 hours: Temp:  [97.1 F (36.2 C)-98.5 F (36.9 C)] 98.2 F (36.8 C) (06/07 0915) Pulse Rate:  [46-72] 69 (06/07 0915) Resp:  [8-18] 17 (06/07 0915) BP: (106-150)/(54-79) 149/70 mmHg (06/07 0915) SpO2:  [97 %-100 %] 99 % (06/07 0915) Arterial Line BP: (120-169)/(63-78) 120/63 mmHg (06/06 1230)    Intake/Output from previous day: 06/06 0701 - 06/07 0700 In: 3120 [I.V.:2993; NG/GT:40; IV Piggyback:87] Out: 800 [Urine:750; Emesis/NG output:50]  GI: soft, non-tender; bowel sounds normal; no masses,  no organomegaly  Lab Results:  CBC  Recent Labs  11/11/14 1408 11/12/14 0505  WBC 8.5 5.0  HGB 10.8* 10.2*  HCT 33.6* 30.1*  PLT 154 132*   CMP     Component Value Date/Time   NA 137 11/12/2014 0505   NA 137 09/24/2014 1339   K 4.4 11/12/2014 0505   K 4.2 09/24/2014 1339   CL 101 11/12/2014 0505   CL 101 09/24/2014 1339   CO2 30 11/12/2014 0505   CO2 30 09/24/2014 1339   GLUCOSE 142* 11/12/2014 0505   GLUCOSE 114* 09/24/2014 1339   BUN 9 11/12/2014 0505   BUN 13 09/24/2014 1339   CREATININE 0.80 11/12/2014 0505   CREATININE 0.77 09/24/2014 1339   CALCIUM 8.0* 11/12/2014 0505   CALCIUM 8.4* 09/24/2014 1339   PROT 5.6* 11/12/2014 0505   PROT 6.2* 09/24/2014 1339   ALBUMIN 2.8* 11/12/2014 0505   ALBUMIN 3.7 09/24/2014 1339   AST 18 11/12/2014 0505   AST 18 09/24/2014 1339   ALT 13* 11/12/2014 0505   ALT 11* 09/24/2014 1339   ALKPHOS 53 11/12/2014 0505   ALKPHOS 59 09/24/2014 1339   BILITOT 0.2* 11/12/2014 0505   GFRNONAA >60 11/12/2014 0505   GFRNONAA >60 09/24/2014 1339   GFRAA >60 11/12/2014 0505   GFRAA >60 09/24/2014 1339   PT/INR No results for input(s): LABPROT, INR in the last 72 hours.  Studies/Results: No results  found.  Assessment/Plan: Expected first day postop. I offered her Zofran and have decreased the Phenergan dose to 12.5 mg.

## 2014-11-13 LAB — TYPE AND SCREEN
ABO/RH(D): A POS
Antibody Screen: NEGATIVE
Unit division: 0
Unit division: 0

## 2014-11-13 NOTE — Progress Notes (Signed)
2 Days Post-Op   Subjective:  Slept better last night with analgesics. Not passing any flatus. Feels better today. Minimal nasogastric output.  Vital signs in last 24 hours: Temp:  [98.2 F (36.8 C)-99.5 F (37.5 C)] 98.2 F (36.8 C) (06/08 0517) Pulse Rate:  [74-76] 74 (06/08 0743) Resp:  [16-18] 16 (06/08 0743) BP: (108-156)/(64-77) 126/71 mmHg (06/08 0743) SpO2:  [93 %-100 %] 93 % (06/08 0743)    Intake/Output from previous day: 06/07 0701 - 06/08 0700 In: 2110 [I.V.:2090; NG/GT:20] Out: 1925 [Urine:1875; Emesis/NG output:50]  GI: soft, non-tender; bowel sounds normal; no masses,  no organomegaly  Lab Results:  CBC  Recent Labs  11/11/14 1408 11/12/14 0505  WBC 8.5 5.0  HGB 10.8* 10.2*  HCT 33.6* 30.1*  PLT 154 132*   CMP     Component Value Date/Time   NA 137 11/12/2014 0505   NA 137 09/24/2014 1339   K 4.4 11/12/2014 0505   K 4.2 09/24/2014 1339   CL 101 11/12/2014 0505   CL 101 09/24/2014 1339   CO2 30 11/12/2014 0505   CO2 30 09/24/2014 1339   GLUCOSE 142* 11/12/2014 0505   GLUCOSE 114* 09/24/2014 1339   BUN 9 11/12/2014 0505   BUN 13 09/24/2014 1339   CREATININE 0.80 11/12/2014 0505   CREATININE 0.77 09/24/2014 1339   CALCIUM 8.0* 11/12/2014 0505   CALCIUM 8.4* 09/24/2014 1339   PROT 5.6* 11/12/2014 0505   PROT 6.2* 09/24/2014 1339   ALBUMIN 2.8* 11/12/2014 0505   ALBUMIN 3.7 09/24/2014 1339   AST 18 11/12/2014 0505   AST 18 09/24/2014 1339   ALT 13* 11/12/2014 0505   ALT 11* 09/24/2014 1339   ALKPHOS 53 11/12/2014 0505   ALKPHOS 59 09/24/2014 1339   BILITOT 0.2* 11/12/2014 0505   GFRNONAA >60 11/12/2014 0505   GFRNONAA >60 09/24/2014 1339   GFRAA >60 11/12/2014 0505   GFRAA >60 09/24/2014 1339   PT/INR No results for input(s): LABPROT, INR in the last 72 hours.  Studies/Results: No results found.  Assessment/Plan: Improved. Discontinue nasogastric tube and Foley catheter. Remain on very limited ice chips.

## 2014-11-14 LAB — COMPREHENSIVE METABOLIC PANEL
ALBUMIN: 2.5 g/dL — AB (ref 3.5–5.0)
ALT: 10 U/L — ABNORMAL LOW (ref 14–54)
AST: 14 U/L — ABNORMAL LOW (ref 15–41)
Alkaline Phosphatase: 53 U/L (ref 38–126)
Anion gap: 5 (ref 5–15)
BUN: 6 mg/dL (ref 6–20)
CO2: 31 mmol/L (ref 22–32)
CREATININE: 0.88 mg/dL (ref 0.44–1.00)
Calcium: 7.6 mg/dL — ABNORMAL LOW (ref 8.9–10.3)
Chloride: 100 mmol/L — ABNORMAL LOW (ref 101–111)
GFR calc non Af Amer: >60 mL/min (ref >60)
GLUCOSE: 118 mg/dL — AB (ref 65–99)
POTASSIUM: 3.9 mmol/L (ref 3.5–5.1)
Sodium: 136 mmol/L (ref 135–145)
Total Bilirubin: 0.4 mg/dL (ref 0.3–1.2)
Total Protein: 5.3 g/dL — ABNORMAL LOW (ref 6.5–8.1)

## 2014-11-14 LAB — CBC
HEMATOCRIT: 27.7 % — AB (ref 35.0–47.0)
Hemoglobin: 8.9 g/dL — ABNORMAL LOW (ref 12.0–16.0)
MCH: 29.5 pg (ref 26.0–34.0)
MCHC: 32.3 g/dL (ref 32.0–36.0)
MCV: 91.2 fL (ref 80.0–100.0)
Platelets: 122 10*3/uL — ABNORMAL LOW (ref 150–440)
RBC: 3.03 MIL/uL — ABNORMAL LOW (ref 3.80–5.20)
RDW: 15.2 % — ABNORMAL HIGH (ref 11.5–14.5)
WBC: 4 10*3/uL (ref 3.6–11.0)

## 2014-11-14 MED ORDER — ALPRAZOLAM 0.25 MG PO TABS
0.2500 mg | ORAL_TABLET | Freq: Two times a day (BID) | ORAL | Status: DC
  Administered 2014-11-14 – 2014-11-17 (×6): 0.25 mg via ORAL
  Filled 2014-11-14 (×6): qty 1

## 2014-11-14 NOTE — Progress Notes (Signed)
3 Days Post-Op   Subjective:  Still having pretty bad pain, a little bit of nausea, and still no gas rumblings, flatus, or bowel movement.  Vital signs in last 24 hours: Temp:  [98.2 F (36.8 C)-99.1 F (37.3 C)] 98.6 F (37 C) (06/09 0811) Pulse Rate:  [66-75] 67 (06/09 0811) Resp:  [16-18] 17 (06/09 0540) BP: (108-141)/(53-75) 121/71 mmHg (06/09 0811) SpO2:  [87 %-100 %] 94 % (06/09 0849)    Intake/Output from previous day: 06/08 0701 - 06/09 0700 In: 2529 [I.V.:2529] Out: 1600 [Urine:1600]  GI: soft, non-tender; bowel sounds normal; no masses,  no organomegaly  Lab Results:  CBC  Recent Labs  11/12/14 0505 11/13/14 2348  WBC 5.0 4.0  HGB 10.2* 8.9*  HCT 30.1* 27.7*  PLT 132* 122*   CMP     Component Value Date/Time   NA 136 11/13/2014 2348   NA 137 09/24/2014 1339   K 3.9 11/13/2014 2348   K 4.2 09/24/2014 1339   CL 100* 11/13/2014 2348   CL 101 09/24/2014 1339   CO2 31 11/13/2014 2348   CO2 30 09/24/2014 1339   GLUCOSE 118* 11/13/2014 2348   GLUCOSE 114* 09/24/2014 1339   BUN 6 11/13/2014 2348   BUN 13 09/24/2014 1339   CREATININE 0.88 11/13/2014 2348   CREATININE 0.77 09/24/2014 1339   CALCIUM 7.6* 11/13/2014 2348   CALCIUM 8.4* 09/24/2014 1339   PROT 5.3* 11/13/2014 2348   PROT 6.2* 09/24/2014 1339   ALBUMIN 2.5* 11/13/2014 2348   ALBUMIN 3.7 09/24/2014 1339   AST 14* 11/13/2014 2348   AST 18 09/24/2014 1339   ALT 10* 11/13/2014 2348   ALT 11* 09/24/2014 1339   ALKPHOS 53 11/13/2014 2348   ALKPHOS 59 09/24/2014 1339   BILITOT 0.4 11/13/2014 2348   GFRNONAA >60 11/13/2014 2348   GFRNONAA >60 09/24/2014 1339   GFRAA >60 11/13/2014 2348   GFRAA >60 09/24/2014 1339   PT/INR No results for input(s): LABPROT, INR in the last 72 hours.  Studies/Results: No results found.  Assessment/Plan: Slow, steady, improvement. I discussed the operative findings with her in detail. Limited clear liquids, including coffee.

## 2014-11-14 NOTE — Care Management Note (Signed)
Case Management Note  Patient Details  Name: Kimberly Thornton MRN: [id number] Date of Birth: Nov 19, 1950  Subjective/Objective:   TC to patient to let her know I was coming to speak with her regarding discharge planning. She ask that I not come to the room since I have a cold. Patient with gastric cancer. POD  3 ,gastroduodenostomy. Prior to admission pt was independent, ambulatory and driving. Ambulating with SBA to bedside commode now with generalized weakness. Requested PT consult. Patient is requesting a hospital bed. She states she is unable to lay back in the bed due to  incisional discomfort. Pt reports she must have the HOB at a min of 30 degrees.  Price with insurance approval is $20/mon, without is $135/mon. Will update patient. Requested bed from [facility].   Action/Plan: Referral to Will with [facility]. He will speak with patient.    Expected Discharge Date:                 Expected Discharge Plan:  [place]  In-House Referral:     Discharge planning Services     Post Acute Care Choice:    Choice offered to:  Patient  DME Arranged:    DME Agency:     HH Arranged:    [place] Agency:     Status of Service:  In process, will continue to follow  Medicare Important Message Given:    Date Medicare IM Given:    Medicare IM give by:    Date Additional Medicare IM Given:    Additional Medicare Important Message give by:     If discussed at [place] of Stay Meetings, dates discussed:    Additional Comments:  Jolly Mango, RN 11/14/2014, 2:59 PM

## 2014-11-14 NOTE — Clinical Documentation Improvement (Signed)
Labs as below.  Please identify any clinical conditions associated with the abnormal lab values, if any, and document in your progress note and carry over to the discharge summary.    Component      Hemoglobin HCT  Latest Ref Rng      12.0 - 16.0 g/dL 35.0 - 47.0 %  11/11/2014      10.8 (L) 33.6 (L)  11/12/2014      10.2 (L) 30.1 (L)  11/13/2014      8.9 (L) 27.7 (L)   Component      Platelets  Latest Ref Rng      150 - 440 K/uL  11/11/2014      154  11/12/2014      132 (L)  11/13/2014      122 (L)   Possible Clinical Conditions (more than one may apply): -Anemia (if present please specify acuity and type) -Thrombocytopenia -Other conditions (please specify) -Unable to determine at present  Thank you, Mateo Flow, RN [phone] Clinical Documentation Specialist

## 2014-11-14 NOTE — Progress Notes (Signed)
Per Dr. Leanora Cover put in order for physical therapy consult as well as ordering xanax back at dose pt takes at home.

## 2014-11-15 MED ORDER — BOOST / RESOURCE BREEZE PO LIQD
1.0000 | Freq: Three times a day (TID) | ORAL | Status: DC
  Administered 2014-11-15 – 2014-11-16 (×3): 1 via ORAL

## 2014-11-15 MED ORDER — FENTANYL 12 MCG/HR TD PT72
12.5000 ug | MEDICATED_PATCH | TRANSDERMAL | Status: DC
  Filled 2014-11-15: qty 1

## 2014-11-15 MED ORDER — MORPHINE SULFATE 2 MG/ML IJ SOLN
1.0000 mg | INTRAMUSCULAR | Status: DC | PRN
  Administered 2014-11-16: 2 mg via INTRAVENOUS
  Filled 2014-11-15: qty 1

## 2014-11-15 MED ORDER — OXYCODONE-ACETAMINOPHEN 5-325 MG PO TABS
1.0000 | ORAL_TABLET | ORAL | Status: DC | PRN
  Administered 2014-11-15 (×2): 2 via ORAL
  Administered 2014-11-16: 1 via ORAL
  Administered 2014-11-16: 2 via ORAL
  Filled 2014-11-15: qty 2
  Filled 2014-11-15: qty 1
  Filled 2014-11-15 (×2): qty 2

## 2014-11-15 NOTE — Progress Notes (Signed)
4 Days Post-Op   Subjective:  Patient is tolerating a clear liquid diet and passing flatus. She feels much better. Pathology report is still pending.  Vital signs in last 24 hours: Temp:  [98 F (36.7 C)-98.7 F (37.1 C)] 98 F (36.7 C) (06/10 0832) Pulse Rate:  [58-74] 58 (06/10 0937) Resp:  [16-18] 16 (06/09 2337) BP: (97-129)/(62-68) 129/68 mmHg (06/10 0832) SpO2:  [92 %-100 %] 94 % (06/10 0937)    Intake/Output from previous day: 06/09 0701 - 06/10 0700 In: 2443 [P.O.:440; I.V.:2003] Out: 3600 [Urine:3600]  GI: soft, non-tender; bowel sounds normal; no masses,  no organomegaly  Lab Results:  CBC  Recent Labs  11/13/14 2348  WBC 4.0  HGB 8.9*  HCT 27.7*  PLT 122*   CMP     Component Value Date/Time   NA 136 11/13/2014 2348   NA 137 09/24/2014 1339   K 3.9 11/13/2014 2348   K 4.2 09/24/2014 1339   CL 100* 11/13/2014 2348   CL 101 09/24/2014 1339   CO2 31 11/13/2014 2348   CO2 30 09/24/2014 1339   GLUCOSE 118* 11/13/2014 2348   GLUCOSE 114* 09/24/2014 1339   BUN 6 11/13/2014 2348   BUN 13 09/24/2014 1339   CREATININE 0.88 11/13/2014 2348   CREATININE 0.77 09/24/2014 1339   CALCIUM 7.6* 11/13/2014 2348   CALCIUM 8.4* 09/24/2014 1339   PROT 5.3* 11/13/2014 2348   PROT 6.2* 09/24/2014 1339   ALBUMIN 2.5* 11/13/2014 2348   ALBUMIN 3.7 09/24/2014 1339   AST 14* 11/13/2014 2348   AST 18 09/24/2014 1339   ALT 10* 11/13/2014 2348   ALT 11* 09/24/2014 1339   ALKPHOS 53 11/13/2014 2348   ALKPHOS 59 09/24/2014 1339   BILITOT 0.4 11/13/2014 2348   GFRNONAA >60 11/13/2014 2348   GFRNONAA >60 09/24/2014 1339   GFRAA >60 11/13/2014 2348   GFRAA >60 09/24/2014 1339   PT/INR No results for input(s): LABPROT, INR in the last 72 hours.  Studies/Results: No results found.  Assessment/Plan: Expected recovery following an antrectomy. Full liquid diet today with postgastrectomy (5 or 6/2 portion meals per day) diet tomorrow morning, and Possible discharge  tomorrow.

## 2014-11-15 NOTE — Progress Notes (Signed)
Nutrition Follow-up  DOCUMENTATION CODES:     INTERVENTION:   (Medical Nutrition Supplement: ) Spoke with Dr. Leanora Cover and agreeable to adding boost breeze TID for added nutrition.  Pt requesting peach flavor.   NUTRITION DIAGNOSIS:  Inadequate oral intake related to inability to eat as evidenced by NPO status, improving as diet being progressed.    GOAL:  Patient will meet greater than or equal to 90% of their needs    MONITOR:   (Energy Intake, Electrolyte and Renal Profile, Digestive System, Anthropometrics)  REASON FOR ASSESSMENT:  Malnutrition Screening Tool    ASSESSMENT:  Pt sipping on liquids this am.   Electrolyte and Renal Profile:  Recent Labs Lab 11/11/14 1408 11/12/14 0505 11/13/14 2348  BUN  --  9 6  CREATININE 0.70 0.80 0.88  NA  --  137 136  K  --  4.4 3.9  MG  --  1.7  --    Medications: reviewed   Unable to complete Nutrition-Focused physical exam at this time, eating breakfast  Height:  Ht Readings from Last 1 Encounters:  11/11/14 4\' 11"  (1.499 m)    Weight:  Wt Readings from Last 1 Encounters:  11/11/14 111 lb (50.349 kg)      BMI:  Body mass index is 22.41 kg/(m^2).  Estimated Nutritional Needs:  Kcal:  BEE: 945kcals, TEE: (IF 1.2-1.4)(AF 1.2) 1365-1613kcals  Protein:  56-65g protein (1.1-1.3g/kg)   Fluid:  1263-152mL of fluid (25-49mL/kg)   Diet Order:  Diet full liquid Room service appropriate?: Yes; Fluid consistency:: Thin Diet regular Room service appropriate?: Yes; Fluid consistency:: Thin  EDUCATION NEEDS:  Education needs no appropriate at this time   Intake/Output Summary (Last 24 hours) at 11/15/14 1050 Last data filed at 11/15/14 0900  Gross per 24 hour  Intake   2178 ml  Output   3300 ml  Net  -1122 ml    Last BM:  No bm noted, but + flatus noted  MODERATE Care Level Kimberly Thornton, [place], [place] (pager)

## 2014-11-15 NOTE — Evaluation (Signed)
Physical Therapy Evaluation Patient Details Name: Kimberly Thornton MRN: [id number] DOB: [birth year] Today's Date: 11/15/2014   History of Present Illness  admitted for acute hospitailzation status post antrectomy and gastroduodenostomy (6/6); pending pathology report.  Clinical Impression  Upon evaluation, patient alert and oriented to all information; follows all commands and demonstrates good safety awareness/insight.  UE/LE strength and ROM grossly WFL for basic transfers and mobility; pain well-controlled at 2-3/10 throughout session.  Able to complete bed mobility indep; sit/stand, basic transfers and gait (225') without assist device, cga.  Occasional L lateral LOB, but patient able to self-correct with LE step strategy. May benefit from use of SPC (esp with longer, community distances/environments); will trial in subsequent sessions. Would benefit from skilled PT to address above deficits and promote optimal return to PLOF;Recommend transition to [place] upon discharge from acute hospitalization. Patient/family aware of recommendations and in agreement with plan.     Follow Up Recommendations Home health PT    Equipment Recommendations  Cane    Recommendations for Other Services       Precautions / Restrictions Precautions Precautions: Fall Restrictions Weight Bearing Restrictions: No      Mobility  Bed Mobility Overal bed mobility: Independent;Modified Independent             General bed mobility comments: unable to tolerate full flat position due to stress on abdominal area  Transfers Overall transfer level: Needs assistance Equipment used: None Transfers: Sit to/from Stand Sit to Stand: Min guard            Ambulation/Gait Ambulation/Gait assistance: Min guard Ambulation Distance (Feet): 225 Feet Assistive device: None     Gait velocity interpretation: <1.8 ft/sec, indicative of risk for recurrent falls (10' gait distance in 8 seconds) General Gait  Details: reciprocal stepping pattern with fair step height/length; mildly guarded trunk and arm rotation.  Occasional LOB to L requiring LE step strategy for recovery; may benefit from trial of SPC in subsequent trials.  Stairs            Wheelchair Mobility    Modified Rankin (Stroke Patients Only)       Balance Overall balance assessment: Needs assistance Sitting-balance support: No upper extremity supported;Feet supported Sitting balance-Leahy Scale: Good     Standing balance support: No upper extremity supported Standing balance-Leahy Scale: Fair                               Pertinent Vitals/Pain Pain Assessment: 0-10 Pain Score: 2  Pain Location: abdomen Pain Descriptors / Indicators: Sore Pain Intervention(s): Limited activity within patient's tolerance;Monitored during session;Repositioned    Home Living Family/patient expects to be discharged to:: Private residence Living Arrangements: Spouse/significant other Available Help at Discharge: Family Type of Home: House Home Access: Stairs to enter Entrance Stairs-Rails: None Entrance Stairs-Number of Steps: 3 Home Layout: One level Home Equipment: None      Prior Function Level of Independence: Independent         Comments: Indep for household/community mobility without assist device; + driving.  Working full-time as Freight forwarder at [employer]        Extremity/Trunk Assessment   Upper Extremity Assessment: Overall WFL for tasks assessed           Lower Extremity Assessment: Overall WFL for tasks assessed         Communication   Communication: No difficulties  Cognition Arousal/Alertness: Awake/alert  Behavior During Therapy: WFL for tasks assessed/performed Overall Cognitive Status: Within Functional Limits for tasks assessed                      General Comments      Exercises        Assessment/Plan    PT Assessment Patient needs  continued PT services  PT Diagnosis Difficulty walking;Generalized weakness   PT Problem List Decreased strength;Decreased range of motion;Decreased activity tolerance;Decreased balance;Decreased mobility;Decreased coordination;Decreased knowledge of use of DME;Decreased safety awareness;Decreased knowledge of precautions;Pain  PT Treatment Interventions DME instruction;Gait training;Stair training;Functional mobility training;Therapeutic activities;Therapeutic exercise;Balance training;Patient/family education   PT Goals (Current goals can be found in the Care Plan section) Acute Rehab PT Goals Patient Stated Goal: "to get up and move around" PT Goal Formulation: With patient Time For Goal Achievement: 11/29/14 Potential to Achieve Goals: Good    Frequency Min 2X/week   Barriers to discharge        Co-evaluation               End of Session Equipment Utilized During Treatment: Gait belt Activity Tolerance: Patient tolerated treatment well Patient left: in bed;with call bell/phone within reach;with bed alarm set;with family/visitor present Nurse Communication: Mobility status         Time: 9872-1587 PT Time Calculation (min) (ACUTE ONLY): 17 min   Charges:   PT Evaluation $Initial PT Evaluation Tier I: 1 Procedure     PT G Codes:        Macala Baldonado H. Owens Shark, PT, DPT 11/15/2014, 10:37 AM [phone]

## 2014-11-15 NOTE — Care Management Note (Signed)
Case Management Note  Patient Details  Name: Kimberly Thornton MRN: [id number] Date of Birth: 01-22-1951  Subjective/Objective:   Spoke with patient to discuss discharge planning. She is agreeable to home health services with no agency preference. Referral to [facility] [place] for nursing and PT. It is anticipated that patient will discharge tomorrow. Please notify [facility] if patient discharges on Saturday. Hospital bed to be delivered today.                 Action/Plan:   Expected Discharge Date:  11/16/2014               Expected Discharge Plan:  [place]  In-House Referral:     Discharge planning Services     Post Acute Care Choice:  Home Health Choice offered to:  Patient  DME Arranged:  Hospital bed DME Agency:  [place]:  RN, PT [facility] Agency:  [place]  Status of Service:  In process, will continue to follow  Medicare Important Message Given:    Date Medicare IM Given:    Medicare IM give by:    Date Additional Medicare IM Given:    Additional Medicare Important Message give by:     If discussed at [place] of Stay Meetings, dates discussed:    Additional Comments:  Jolly Mango, RN 11/15/2014, 10:59 AM

## 2014-11-16 MED ORDER — TRAMADOL HCL 50 MG PO TABS
50.0000 mg | ORAL_TABLET | ORAL | Status: DC | PRN
  Administered 2014-11-16 – 2014-11-17 (×4): 50 mg via ORAL
  Filled 2014-11-16 (×5): qty 1

## 2014-11-16 MED ORDER — FAMOTIDINE 20 MG PO TABS: 20.0000 mg | ORAL_TABLET | Freq: Two times a day (BID) | ORAL | Status: DC

## 2014-11-16 MED ORDER — BOOST / RESOURCE BREEZE PO LIQD: 1.0000 | Freq: Three times a day (TID) | ORAL | Status: DC

## 2014-11-16 MED ORDER — HYDROCODONE-ACETAMINOPHEN 5-325 MG PO TABS: 1.0000 | ORAL_TABLET | Freq: Four times a day (QID) | ORAL | Status: DC | PRN

## 2014-11-16 MED ORDER — ONDANSETRON HCL 4 MG/2ML IJ SOLN
4.0000 mg | Freq: Three times a day (TID) | INTRAMUSCULAR | Status: DC | PRN
  Administered 2014-11-16 (×2): 4 mg via INTRAVENOUS
  Filled 2014-11-16 (×2): qty 2

## 2014-11-16 MED ORDER — ONDANSETRON HCL 4 MG PO TABS
4.0000 mg | ORAL_TABLET | Freq: Four times a day (QID) | ORAL | Status: DC | PRN
  Administered 2014-11-16 – 2014-11-17 (×3): 4 mg via ORAL
  Filled 2014-11-16 (×3): qty 1

## 2014-11-16 MED ORDER — FAMOTIDINE IN NACL 20-0.9 MG/50ML-% IV SOLN
20.0000 mg | Freq: Two times a day (BID) | INTRAVENOUS | Status: DC
  Filled 2014-11-16 (×2): qty 50

## 2014-11-16 MED ORDER — ONDANSETRON HCL 4 MG/2ML IJ SOLN
4.0000 mg | Freq: Three times a day (TID) | INTRAMUSCULAR | Status: DC | PRN
  Administered 2014-11-16: 4 mg via INTRAVENOUS
  Filled 2014-11-16: qty 2

## 2014-11-16 NOTE — Care Management Note (Signed)
Case Management Note  Patient Details  Name: Deaunna Olarte MRN: [id number] Date of Birth: July 01, 1950  Subjective/Objective:        Discussed discharge planning with Ms Holmes-Clark's nurse, no discharge orders in computer. Hospital bed was ordered by case manager Lattie Haw on Friday. Ms [facility] will receive RN and PT services through [facility] per Lattie Haw. Ms Holmes-Clark reports that her physician told her today that they would discuss a discharge plan tomorrow. Ms Holmes-Clark reports that a hospital bed is set up at her home.               Expected Discharge Date:  11/14/14               Expected Discharge Plan:  [place]  In-House Referral:     Discharge planning Services     Post Acute Care Choice:  Home Health Choice offered to:  Patient  DME Arranged:  Hospital bed DME Agency:  [facility]:  RN, PT [facility] Agency:  [place]  Status of Service:  In process, will continue to follow  Medicare Important Message Given:    Date Medicare IM Given:    Medicare IM give by:    Date Additional Medicare IM Given:    Additional Medicare Important Message give by:     If discussed at [place] of Stay Meetings, dates discussed:    Additional Comments:  Halynn Reitano A, RN 11/16/2014, 4:52 PM

## 2014-11-16 NOTE — Progress Notes (Signed)
5 Days Post-Op   Subjective:  Tolerated breakfast well but got nauseous after lunch. He does not want a fentanyl patch for pain (because this reminds her of her mother), Phenergan for nausea, or Percocet for pain. Passing flatus.  Vital signs in last 24 hours: Temp:  [97.4 F (36.3 C)-97.7 F (36.5 C)] 97.4 F (36.3 C) (06/11 0756) Pulse Rate:  [49-57] 57 (06/11 0756) Resp:  [16] 16 (06/11 0756) BP: (103-140)/(62-73) 140/73 mmHg (06/11 0756) SpO2:  [90 %-98 %] 98 % (06/11 0756) Last BM Date: 11/11/14  Intake/Output from previous day: 06/10 0701 - 06/11 0700 In: 3002.7 [P.O.:600; I.V.:2402.7] Out: 1000 [Urine:1000]  GI: soft, non-tender; bowel sounds normal; no masses,  no organomegaly  Lab Results:  CBC  Recent Labs  11/13/14 2348  WBC 4.0  HGB 8.9*  HCT 27.7*  PLT 122*   CMP     Component Value Date/Time   NA 136 11/13/2014 2348   NA 137 09/24/2014 1339   K 3.9 11/13/2014 2348   K 4.2 09/24/2014 1339   CL 100* 11/13/2014 2348   CL 101 09/24/2014 1339   CO2 31 11/13/2014 2348   CO2 30 09/24/2014 1339   GLUCOSE 118* 11/13/2014 2348   GLUCOSE 114* 09/24/2014 1339   BUN 6 11/13/2014 2348   BUN 13 09/24/2014 1339   CREATININE 0.88 11/13/2014 2348   CREATININE 0.77 09/24/2014 1339   CALCIUM 7.6* 11/13/2014 2348   CALCIUM 8.4* 09/24/2014 1339   PROT 5.3* 11/13/2014 2348   PROT 6.2* 09/24/2014 1339   ALBUMIN 2.5* 11/13/2014 2348   ALBUMIN 3.7 09/24/2014 1339   AST 14* 11/13/2014 2348   AST 18 09/24/2014 1339   ALT 10* 11/13/2014 2348   ALT 11* 09/24/2014 1339   ALKPHOS 53 11/13/2014 2348   ALKPHOS 59 09/24/2014 1339   BILITOT 0.4 11/13/2014 2348   GFRNONAA >60 11/13/2014 2348   GFRNONAA >60 09/24/2014 1339   GFRAA >60 11/13/2014 2348   GFRAA >60 09/24/2014 1339   PT/INR No results for input(s): LABPROT, INR in the last 72 hours.  Studies/Results: No results found.  Assessment/Plan: Continued improvement. Medication changes with possible discharge  tomorrow.

## 2014-11-17 MED ORDER — TRAMADOL HCL 50 MG PO TABS: 50.0000 mg | ORAL_TABLET | ORAL | Status: DC | PRN

## 2014-11-17 NOTE — Discharge Instructions (Signed)
*  Notify MD if you have difficulty tolerating solid foods or liquids.  *Monitor for signs of infection such as fever, drainage from your incision, redness or swelling at the incision site, increasing pain not relieved with prescribed pain medications.  *Take all medications as prescribed.  *Notify your doctor with all questions or concerns.

## 2014-11-17 NOTE — Care Management Note (Signed)
Case Management Note  Patient Details  Name: Kimberly Thornton MRN: [id number] Date of Birth: 27-Mar-1951  Subjective/Objective:    Hospital bed set up at [name]'s home. Referral faxed to [facility] for PT and RN.                    Expected Discharge Date:  11/14/14               Expected Discharge Plan:  [place]  In-House Referral:     Discharge planning Services     Post Acute Care Choice:  Home Health Choice offered to:  Patient  DME Arranged:  Hospital bed DME Agency:  [place]:  RN, PT [facility] Agency:  [place]  Status of Service:  In process, will continue to follow  Medicare Important Message Given:    Date Medicare IM Given:    Medicare IM give by:    Date Additional Medicare IM Given:    Additional Medicare Important Message give by:     If discussed at [place] of Stay Meetings, dates discussed:    Additional Comments:  Eylin Pontarelli A, RN 11/17/2014, 1:24 PM

## 2014-11-17 NOTE — Progress Notes (Signed)
Patient was concerned that she has not had a bowel movement since surgery.  Dr. Leanora Cover was notified of this and would like for patient to take 12ml of milk of magnesia if she has not had a bm by 11/18/14.  Every other staple has been removed and steri strips were placed, per order.  Discharge instructions and instructions for follow up appointment was reviewed with patient and her daughter.  Questions were encouraged. Both verbalized understanding of information given.

## 2014-11-17 NOTE — Progress Notes (Signed)
6 Days Post-Op   Subjective:  Patient is eating much better with no nausea. She is taking only tramadol for pain and occasional Zofran for nausea. She has noticed that she has early satiety and understands she needs to eat multiple small meals per day.  Vital signs in last 24 hours: Temp:  [97.9 F (36.6 C)-98 F (36.7 C)] 97.9 F (36.6 C) (06/12 0752) Pulse Rate:  [56-57] 57 (06/12 0752) Resp:  [16] 16 (06/12 0752) BP: (128-139)/(70-76) 139/76 mmHg (06/12 0752) SpO2:  [95 %-100 %] 95 % (06/12 0752) Last BM Date: 11/11/14  Intake/Output from previous day: 06/11 0701 - 06/12 0700 In: 1369.7 [I.V.:1369.7] Out: 950 [Urine:950]  Lab Results:  CBC No results for input(s): WBC, HGB, HCT, PLT in the last 72 hours. CMP     Component Value Date/Time   NA 136 11/13/2014 2348   NA 137 09/24/2014 1339   K 3.9 11/13/2014 2348   K 4.2 09/24/2014 1339   CL 100* 11/13/2014 2348   CL 101 09/24/2014 1339   CO2 31 11/13/2014 2348   CO2 30 09/24/2014 1339   GLUCOSE 118* 11/13/2014 2348   GLUCOSE 114* 09/24/2014 1339   BUN 6 11/13/2014 2348   BUN 13 09/24/2014 1339   CREATININE 0.88 11/13/2014 2348   CREATININE 0.77 09/24/2014 1339   CALCIUM 7.6* 11/13/2014 2348   CALCIUM 8.4* 09/24/2014 1339   PROT 5.3* 11/13/2014 2348   PROT 6.2* 09/24/2014 1339   ALBUMIN 2.5* 11/13/2014 2348   ALBUMIN 3.7 09/24/2014 1339   AST 14* 11/13/2014 2348   AST 18 09/24/2014 1339   ALT 10* 11/13/2014 2348   ALT 11* 09/24/2014 1339   ALKPHOS 53 11/13/2014 2348   ALKPHOS 59 09/24/2014 1339   BILITOT 0.4 11/13/2014 2348   GFRNONAA >60 11/13/2014 2348   GFRNONAA >60 09/24/2014 1339   GFRAA >60 11/13/2014 2348   GFRAA >60 09/24/2014 1339   PT/INR No results for input(s): LABPROT, INR in the last 72 hours.  Studies/Results: No results found.  Assessment/Plan: Discharge home on tramadol, Zofran, Zantac. Discontinue omeprazole. Remove every other staple and Steri-Strips in between. Follow-up with Dr.  Pat Patrick in 8 days for remaining staple removal.

## 2014-11-17 NOTE — Discharge Summary (Signed)
Patient ID: Kimberly Thornton MRN: [id number] DOB/AGE: [birth year] 64 y.o.  Admit date: 11/11/2014 Discharge date: 11/17/2014  Discharge Diagnoses:  Gastric cancer, metastatic to liver, with complete pathologic and surgical exploration response  Procedures Performed: Antrectomy, intraoperative ultrasound of liver  Discharged Condition: good  Hospital Course: The patient underwent the above-mentioned procedure with the above-mentioned findings. Once her nasogastric tube was removed her diet was advanced to where she was tolerating 5 or 6 small meals per day. Her pain was controlled with tramadol and her nausea was controlled with Zofran 4 mg by mouth. Every other staple was removed and she was discharged home to follow-up with Dr. Pat Patrick in 8 days and call the office in the interim for any surgical problems.  Discharge Orders: Discharge Instructions    Remove staples    Complete by:  As directed   Remove every other staple and place full length Steri-Strips in between remaining staples with benzoin           Disposition: Final discharge disposition not confirmed  Discharge Medications:  Current facility-administered medications:  .  acetaminophen (TYLENOL) suppository 650 mg, 650 mg, Rectal, Q4H PRN, Molly Maduro, MD .  ALPRAZolam Duanne Moron) tablet 0.25 mg, 0.25 mg, Oral, BID, Molly Maduro, MD, 0.25 mg at 11/17/14 0927 .  brimonidine (ALPHAGAN) 0.2 % ophthalmic solution 1 drop, 1 drop, Left Eye, BID, 1 drop at 11/17/14 0927 **AND** [DISCONTINUED] timolol (TIMOPTIC) 0.5 % ophthalmic solution 1 drop, 1 drop, Left Eye, BID, Molly Maduro, MD .  dextrose 5 % and 0.45 % NaCl with KCl 20 mEq/L infusion, , Intravenous, Continuous, Molly Maduro, MD, Last Rate: 50 mL/hr at 11/17/14 0208 .  enoxaparin (LOVENOX) injection 40 mg, 40 mg, Subcutaneous, Q24H, Molly Maduro, MD, 40 mg at 11/17/14 0927 .  feeding supplement (RESOURCE BREEZE) (RESOURCE BREEZE) liquid 1 Container, 1  Container, Oral, TID BM, Molly Maduro, MD, 1 Container at 11/16/14 1500 .  HYDROcodone-acetaminophen (NORCO/VICODIN) 5-325 MG per tablet 1-2 tablet, 1-2 tablet, Oral, Q6H PRN, Molly Maduro, MD .  LORazepam (ATIVAN) bolus via infusion 0.5 mg, 0.5 mg, Intravenous, BID PRN, Molly Maduro, MD .  morphine 2 MG/ML injection 1-3 mg, 1-3 mg, Intravenous, Q2H PRN, Molly Maduro, MD, 2 mg at 11/16/14 2232 .  ondansetron (ZOFRAN) injection 4 mg, 4 mg, Intravenous, Q8H PRN, Molly Maduro, MD, 4 mg at 11/16/14 2231 .  ondansetron (ZOFRAN) tablet 4 mg, 4 mg, Oral, QID PRN, Molly Maduro, MD, 4 mg at 11/17/14 0927 .  phenol (CHLORASEPTIC) mouth spray 1 spray, 1 spray, Mouth/Throat, PRN, Molly Maduro, MD, 1 spray at 11/13/14 0422 .  traMADol (ULTRAM) tablet 50 mg, 50 mg, Oral, Q4H PRN, Molly Maduro, MD, 50 mg at 11/17/14 0626  Follwup: Follow-up Information    Follow up with Dia Crawford III, MD In 8 days.   Specialty:  Surgery   Contact information:   [address] [address] [phone]       Signed: Consuela Mimes 11/17/2014, 12:12 PM

## 2014-11-18 NOTE — Addendum Note (Signed)
Addendum  created 11/18/14 1005 by Molli Barrows, MD   Modules edited: Anesthesia Events, Narrator   Narrator:  Narrator: Event Log Edited

## 2014-11-19 ENCOUNTER — Telehealth: Payer: Self-pay | Admitting: *Deleted

## 2014-11-19 LAB — SURGICAL PATHOLOGY

## 2014-11-19 NOTE — Telephone Encounter (Signed)
Patient's daughter Otila Kluver called with concerns that her mother has not had a BM since her surgery. The patient has taken two doses of milk of mag 30 ml with no results. I directed Otila Kluver to give her mother 1 Dulcolax 5 mg tab 6 hrs after her last milk of mag dose. If the patient does not have a BM then she should take another Dulcolax 5 mg tab and if she has no results in 6 hrs then administer a fleets enema. If she still has no results then she should call our office immediately. Otila Kluver confirmed understanding of directions.

## 2014-11-26 ENCOUNTER — Encounter: Payer: Self-pay | Admitting: Surgery

## 2014-11-26 ENCOUNTER — Ambulatory Visit (INDEPENDENT_AMBULATORY_CARE_PROVIDER_SITE_OTHER): Payer: BLUE CROSS/BLUE SHIELD | Admitting: Surgery

## 2014-11-26 VITALS — BP 127/61 | HR 76 | Temp 97.7°F | Ht 59.0 in | Wt 105.0 lb

## 2014-11-26 DIAGNOSIS — C162 Malignant neoplasm of body of stomach: Secondary | ICD-10-CM

## 2014-11-26 NOTE — Patient Instructions (Signed)
Follow up as needed

## 2014-11-26 NOTE — Progress Notes (Signed)
Subjective:     Patient ID: Kimberly Thornton, female   DOB: 06/20/1950, 64 y.o.   MRN: [id number]  HPI  she is here for first follow-up status post antrectomy for adenocarcinoma of the stomach. She is doing well. 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 She is to see oncology in 1 week.   Review of Systems  Negative     Objective:   Physical Exam   Remaining staples removed no evidence of incisional hernia no drainage no cellulitis no obvious hernia.     Assessment:     Doing well approximate 2 weeks status post antrectomy for residual carcinoma of the stomach.    Plan:     She is doing well. Follow up with oncology in 1 week. See her back in the office as needed. She may return to work at the end of July.

## 2014-11-29 ENCOUNTER — Other Ambulatory Visit: Payer: Self-pay | Admitting: *Deleted

## 2014-11-29 DIAGNOSIS — C50911 Malignant neoplasm of unspecified site of right female breast: Secondary | ICD-10-CM

## 2014-12-03 ENCOUNTER — Inpatient Hospital Stay: Payer: BLUE CROSS/BLUE SHIELD

## 2014-12-03 ENCOUNTER — Ambulatory Visit: Payer: BLUE CROSS/BLUE SHIELD

## 2014-12-03 ENCOUNTER — Ambulatory Visit: Payer: BLUE CROSS/BLUE SHIELD | Admitting: Oncology

## 2014-12-03 ENCOUNTER — Encounter: Payer: Self-pay | Admitting: Oncology

## 2014-12-03 ENCOUNTER — Inpatient Hospital Stay: Payer: BLUE CROSS/BLUE SHIELD | Attending: Oncology | Admitting: Oncology

## 2014-12-03 ENCOUNTER — Other Ambulatory Visit: Payer: BLUE CROSS/BLUE SHIELD

## 2014-12-03 VITALS — BP 111/84 | HR 53 | Temp 94.6°F | Wt 105.4 lb

## 2014-12-03 DIAGNOSIS — F418 Other specified anxiety disorders: Secondary | ICD-10-CM | POA: Insufficient documentation

## 2014-12-03 DIAGNOSIS — Z171 Estrogen receptor negative status [ER-]: Secondary | ICD-10-CM

## 2014-12-03 DIAGNOSIS — M1990 Unspecified osteoarthritis, unspecified site: Secondary | ICD-10-CM | POA: Insufficient documentation

## 2014-12-03 DIAGNOSIS — R5383 Other fatigue: Secondary | ICD-10-CM | POA: Diagnosis not present

## 2014-12-03 DIAGNOSIS — C169 Malignant neoplasm of stomach, unspecified: Secondary | ICD-10-CM | POA: Insufficient documentation

## 2014-12-03 DIAGNOSIS — Z79899 Other long term (current) drug therapy: Secondary | ICD-10-CM | POA: Diagnosis not present

## 2014-12-03 DIAGNOSIS — Z9221 Personal history of antineoplastic chemotherapy: Secondary | ICD-10-CM | POA: Diagnosis not present

## 2014-12-03 DIAGNOSIS — Z90710 Acquired absence of both cervix and uterus: Secondary | ICD-10-CM | POA: Diagnosis not present

## 2014-12-03 DIAGNOSIS — R531 Weakness: Secondary | ICD-10-CM | POA: Diagnosis not present

## 2014-12-03 DIAGNOSIS — C50911 Malignant neoplasm of unspecified site of right female breast: Secondary | ICD-10-CM

## 2014-12-03 DIAGNOSIS — Z87891 Personal history of nicotine dependence: Secondary | ICD-10-CM | POA: Diagnosis not present

## 2014-12-03 DIAGNOSIS — C50411 Malignant neoplasm of upper-outer quadrant of right female breast: Secondary | ICD-10-CM | POA: Diagnosis not present

## 2014-12-03 DIAGNOSIS — K219 Gastro-esophageal reflux disease without esophagitis: Secondary | ICD-10-CM | POA: Insufficient documentation

## 2014-12-03 DIAGNOSIS — C778 Secondary and unspecified malignant neoplasm of lymph nodes of multiple regions: Secondary | ICD-10-CM | POA: Insufficient documentation

## 2014-12-03 LAB — CBC WITH DIFFERENTIAL/PLATELET
BASOS PCT: 1 %
Basophils Absolute: 0 10*3/uL (ref 0–0.1)
Eosinophils Absolute: 0.1 10*3/uL (ref 0–0.7)
Eosinophils Relative: 3 %
HCT: 34.8 % — ABNORMAL LOW (ref 35.0–47.0)
Hemoglobin: 11.2 g/dL — ABNORMAL LOW (ref 12.0–16.0)
LYMPHS PCT: 30 %
Lymphs Abs: 1.1 10*3/uL (ref 1.0–3.6)
MCH: 28.6 pg (ref 26.0–34.0)
MCHC: 32.2 g/dL (ref 32.0–36.0)
MCV: 88.8 fL (ref 80.0–100.0)
Monocytes Absolute: 0.4 10*3/uL (ref 0.2–0.9)
Monocytes Relative: 12 %
Neutro Abs: 1.9 10*3/uL (ref 1.4–6.5)
Neutrophils Relative %: 54 %
PLATELETS: 279 10*3/uL (ref 150–440)
RBC: 3.91 MIL/uL (ref 3.80–5.20)
RDW: 15 % — ABNORMAL HIGH (ref 11.5–14.5)
WBC: 3.5 10*3/uL — AB (ref 3.6–11.0)

## 2014-12-03 LAB — COMPREHENSIVE METABOLIC PANEL
ALBUMIN: 3.4 g/dL — AB (ref 3.5–5.0)
ALK PHOS: 81 U/L (ref 38–126)
ALT: 15 U/L (ref 14–54)
ANION GAP: 4 — AB (ref 5–15)
AST: 21 U/L (ref 15–41)
BILIRUBIN TOTAL: 0.4 mg/dL (ref 0.3–1.2)
BUN: 10 mg/dL (ref 6–20)
CO2: 27 mmol/L (ref 22–32)
Calcium: 8.3 mg/dL — ABNORMAL LOW (ref 8.9–10.3)
Chloride: 100 mmol/L — ABNORMAL LOW (ref 101–111)
Creatinine, Ser: 0.67 mg/dL (ref 0.44–1.00)
GFR calc Af Amer: >60 mL/min (ref >60)
Glucose, Bld: 108 mg/dL — ABNORMAL HIGH (ref 65–99)
POTASSIUM: 3.9 mmol/L (ref 3.5–5.1)
Sodium: 131 mmol/L — ABNORMAL LOW (ref 135–145)
Total Protein: 7.2 g/dL (ref 6.5–8.1)

## 2014-12-03 LAB — MAGNESIUM: Magnesium: 2 mg/dL (ref 1.7–2.4)

## 2014-12-03 MED ORDER — HEPARIN SOD (PORK) LOCK FLUSH 100 UNIT/ML IV SOLN
500.0000 [IU] | Freq: Once | INTRAVENOUS | Status: AC | PRN
  Administered 2014-12-03: 500 [IU]
  Filled 2014-12-03: qty 5

## 2014-12-03 MED ORDER — DIPHENHYDRAMINE HCL 25 MG PO CAPS
50.0000 mg | ORAL_CAPSULE | Freq: Once | ORAL | Status: AC
Start: 2014-12-03 — End: 2014-12-03
  Administered 2014-12-03: 50 mg via ORAL
  Filled 2014-12-03: qty 2

## 2014-12-03 MED ORDER — SODIUM CHLORIDE 0.9 % IV SOLN
Freq: Once | INTRAVENOUS | Status: AC
  Administered 2014-12-03: 10:00:00 via INTRAVENOUS
  Filled 2014-12-03: qty 1000

## 2014-12-03 MED ORDER — SODIUM CHLORIDE 0.9 % IJ SOLN
10.0000 mL | Freq: Once | INTRAMUSCULAR | Status: AC
  Administered 2014-12-03: 10 mL via INTRAVENOUS
  Filled 2014-12-03: qty 10

## 2014-12-03 MED ORDER — ACETAMINOPHEN 325 MG PO TABS
650.0000 mg | ORAL_TABLET | Freq: Once | ORAL | Status: AC
Start: 2014-12-03 — End: 2014-12-03
  Administered 2014-12-03: 650 mg via ORAL
  Filled 2014-12-03: qty 2

## 2014-12-03 MED ORDER — SODIUM CHLORIDE 0.9 % IV SOLN
6.0000 mg/kg | Freq: Once | INTRAVENOUS | Status: AC
  Administered 2014-12-03: 315 mg via INTRAVENOUS
  Filled 2014-12-03: qty 15

## 2014-12-03 NOTE — Progress Notes (Signed)
Patient does not have living will.  Former smoker. Patient requesting refill for Zofran.

## 2014-12-03 NOTE — Progress Notes (Signed)
Kimberly Thornton @ [facility] [phone]  Fax:([phone]) [place] OB: 02/07/1951  MR#: [id number]  BLT#:[id number]  Patient Care Team: Sofie Hartigan, MD as PCP - General (Family Medicine) Dia Crawford III, MD (Surgery) Clent Jacks, RN as Registered Nurse  CHIEF COMPLAINT:  Chief Complaint  Patient presents with  . Follow-up    Oncology History   Subjective: Chief Complaint/Diagnosis:   1. Carcinoma of breast right upper quadrant  (4 cm tumor mass) 3 positive lymph node on needle biopsy estrogen receptor progesterone receptor negative.  HER-2/neu receptor positiveby IHC 3+ Diagnosis November, 2015, T2 N1 M0 tumor stage II disease 2. Muga SCAN OF THE HEART EJECTION FRACTION 66% 3. Abnormal PET scan suggesting a mass in the stomach, and liver metastases 4. Upper endoscopy given fungating mass biopsies consistent with stomach primary cancer, HER-2/neu by IHC 2+ and by fish is 1.86. 5. Liver biopsies negative for any malignancy. 6. BRCA 2 sequencing deleterious mutation in 2014 ins As mentioned above. 7. ENDOSCOPY ULTRASOUND SHOWS STOMACH TUMOR TO BE t3 n0 m0 8.  Has finished total 6 cycles of chemotherapy with Taxotere, carboplatinum, Herceptin with overall good response in the breast as well as, cancer (April of 2016) on maintenance Herceptin therapy from May of 2016 9.  Status post antrectomy and Billroth I gastroduodenostomy with intraoperative ultrasound of the liver on November 15, 2014 No evidence of residual adenocarcinoma.  Well-differentiated neuroendocrine tumor.  Stage I     Breast cancer   10/15/2014 Initial Diagnosis Breast cancer    Oncology Flowsheet 11/15/2014 11/16/2014 11/16/2014 11/16/2014 11/17/2014 11/17/2014 12/03/2014  ALPRAZolam (XANAX) PO   0.25 mg 0.25 mg   0.25 mg   -  diphenhydrAMINE (BENADRYL) PO - - - - - - 50 mg  enoxaparin (LOVENOX) [place]   40 mg     40 mg   -  ondansetron (ZOFRAN) IV - 4 mg 4 mg 4 mg - - -  ondansetron (ZOFRAN)  PO - 4 mg     4 mg 4 mg -  promethazine (PHENERGAN) IV 12.5 mg - - - - - -  trastuzumab (HERCEPTIN) IV - - - - - - 6 mg/kg    INTERVAL HISTORY: 64 year old lady who went upper endoscopy done revealed the stomach tumor to be decrease in size biopsy was negative.  Appetite is improving patient continues to be very apprehensive.  Here to initiate maintenance Herceptin therapy.  No chills.  No fever.  No nausea or vomiting Nov 05, 2014 Patient is here for ongoing evaluation and continuation of Herceptin therapy.  Patient is scheduled for partial gastrectomy on Monday.  No chills.  No fever.  Feeling weak and tired.  Somewhat depressed because of her mother's that recently. June 28: 016 Patient underwent partial gastrectomy.  He tolerated treatment very well.  No evidence of residual adenocarcinoma was found.  Intraoperative ultrasound of the liver revealed only 2 mm hypoechoic area.  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  Patient is here for ongoing evaluation and treatment consideration.  Pathology report is reviewed.  Patient gradually gaining weight.  No sore breath.  REVIEW OF SYSTEMS:   Gen. status: Patient is somewhat apprehensive but not in any acute distress HEENT: No headache.  No hearing loss.  No ear pain.  No nosebleed or congestion.  No sore t hroat.  No difficulty swallowing  Cardiovascular system: No chest pain.  No palpitation.  No paroxysmal nocturnal dyspnea.  Respiratory system: No  cough.  No hemoptysis.  No shortness of breath at rest or exertion.  No chest pain. Abdomen: Abdominal wound is healing well.  No palpable masses. Lower extremities no swelling Skin: No rash Neurological system: No dizziness.  No tingling.  His was.  no tingling numbness.  no focal weakness or any focal signs.  PAST MEDICAL HISTORY: Past Medical History  Diagnosis Date  . Breast cancer   . Arthritis   . GERD (gastroesophageal reflux disease)   . Stomach cancer   . Family history of adverse  reaction to anesthesia     mother had nausea  . Glaucoma     PAST SURGICAL HISTORY: Past Surgical History  Procedure Laterality Date  . Abdominal hysterectomy    . Portacath placement  05/01/2014    Dr. Pat Patrick  . Gastrectomy N/A 11/11/2014    Procedure: Scarlett Presto I, GASTRODUODENOSTOMY, IOUS OF LIVER ;  Surgeon: Molly Maduro, MD;  Location: [facility] ORS;  Service: General;  Laterality: N/A;    FAMILY HISTORY  Significant History/PMH:   Arthritis:    Glaucoma:    Breast Cancer:    GERD - Esophageal Reflux:    Colonoscopy:    Hysterectomy:   Preventive Screening:  Has patient had any of the following test? Colonscopy  Mammography  Pap Smear (1)   Last Colonoscopy: 2010(1)   Last Mammography: 2015(1)   Last Pap Smear: 2012-hysterectomy in 1970's(1)   Smoking History: Smoking History Cigarettes per day and smoked for 25 years less than a half pack per day; quit 17 years ago(1)  PFSH: Comments: mother had a breast cancer when she was 22 years old.mother sister had breast cancer.  Mother also has stomach cancer  Social History: negative alcohol, negative tobacco  Additional Past Medical and Surgical History: gastroesophageal reflux disease  Arthritis  Hysterectomy.  Ovaries were not removed           ADVANCED DIRECTIVES: Patient does not have any advanced healthcare directive. Information has been given.   HEALTH MAINTENANCE: History  Substance Use Topics  . Smoking status: Former Smoker    Quit date: 07/09/1996  . Smokeless tobacco: Never Used  . Alcohol Use: 1.8 - 2.4 oz/week    3-4 Standard drinks or equivalent per week      Allergies  Allergen Reactions  . Codeine Other (See Comments) and Nausea Only    GI distress    Current Outpatient Prescriptions  Medication Sig Dispense Refill  . acetaminophen (TYLENOL) 500 MG tablet Take 500 mg by mouth every 6 (six) hours as needed for moderate pain or headache.    . ALPRAZolam (XANAX) 0.25 MG  tablet Take 0.25 mg by mouth 2 (two) times daily at 8 am and 10 pm.    . brimonidine-timolol (COMBIGAN) 0.2-0.5 % ophthalmic solution Place 1 drop into the left eye 2 (two) times daily.     . citalopram (CELEXA) 20 MG tablet Take 1 tablet (20 mg total) by mouth daily. 30 tablet 3  . LORazepam (ATIVAN) 0.5 MG tablet Take 0.5 mg by mouth at bedtime as needed for anxiety.    . metoCLOPramide (REGLAN) 10 MG tablet Take 1 tablet (10 mg total) by mouth 2 (two) times daily. 60 tablet 3  . ondansetron (ZOFRAN-ODT) 4 MG disintegrating tablet Take 1 tablet (4 mg total) by mouth every 4 (four) hours as needed for nausea. 30 tablet 3  . ranitidine (ZANTAC) 150 MG capsule Take 1 capsule (150 mg total) by mouth 2 (two) times daily. (  Patient taking differently: Take 150 mg by mouth 3 (three) times daily as needed. ) 180 capsule 3  . traMADol (ULTRAM) 50 MG tablet Take 1 tablet (50 mg total) by mouth every 4 (four) hours as needed (Mild pain). 60 tablet 0   No current facility-administered medications for this visit.    OBJECTIVE:  Filed Vitals:   12/03/14 0843  BP: 111/84  Pulse: 53  Temp: 94.6 F (34.8 C)     Body mass index is 21.27 kg/(m^2).    ECOG FS:1 - Symptomatic but completely ambulatory  PHYSICAL EXAM: Gen. status: Patient is alert oriented not any acute distress slightly apprehensive.  Lymphatic system: No palpable supraclavicular cervical or axillary lymphadenopathy examination of breast: Right breast no palpable mass.  Left breast we have masses. Abdomen: Soft.  Liver and spleen not palpable.  Bowel sounds are present.  Lower extremity no swelling.  Neurological system: Heart functions within normal limit cranial nerves are intact no localizing sign skin: No ecchymosis no rash all other system has been reviewed and examined and they are within normal limit Cardiac exam revealed the PMI to be normally situated and sized. The rhythm was regular and no extrasystoles were noted during several  minutes of auscultation. The first and second heart sounds were normal and physiologic splitting of the second heart sound was noted. There were no murmurs, rubs, clicks, or gallops. Examination of the chest was unremarkable. There were no bony deformities, no asymmetry, and no other abnormalities. Examination of the skin revealed no evidence of significant rashes, suspicious appearing nevi or other concerning lesions.  Lower extremity no edema   LAB RESULTS:  Clinical Support on 12/03/2014  Component Date Value Ref Range Status  . WBC 12/03/2014 3.5* 3.6 - 11.0 K/uL Final  . RBC 12/03/2014 3.91  3.80 - 5.20 MIL/uL Final  . Hemoglobin 12/03/2014 11.2* 12.0 - 16.0 g/dL Final  . HCT 12/03/2014 34.8* 35.0 - 47.0 % Final  . MCV 12/03/2014 88.8  80.0 - 100.0 fL Final  . MCH 12/03/2014 28.6  26.0 - 34.0 pg Final  . MCHC 12/03/2014 32.2  32.0 - 36.0 g/dL Final  . RDW 12/03/2014 15.0* 11.5 - 14.5 % Final  . Platelets 12/03/2014 279  150 - 440 K/uL Final  . Neutrophils Relative % 12/03/2014 54   Final  . Neutro Abs 12/03/2014 1.9  1.4 - 6.5 K/uL Final  . Lymphocytes Relative 12/03/2014 30   Final  . Lymphs Abs 12/03/2014 1.1  1.0 - 3.6 K/uL Final  . Monocytes Relative 12/03/2014 12   Final  . Monocytes Absolute 12/03/2014 0.4  0.2 - 0.9 K/uL Final  . Eosinophils Relative 12/03/2014 3   Final  . Eosinophils Absolute 12/03/2014 0.1  0 - 0.7 K/uL Final  . Basophils Relative 12/03/2014 1   Final  . Basophils Absolute 12/03/2014 0.0  0 - 0.1 K/uL Final  . Sodium 12/03/2014 131* 135 - 145 mmol/L Final  . Potassium 12/03/2014 3.9  3.5 - 5.1 mmol/L Final  . Chloride 12/03/2014 100* 101 - 111 mmol/L Final  . CO2 12/03/2014 27  22 - 32 mmol/L Final  . Glucose, Bld 12/03/2014 108* 65 - 99 mg/dL Final  . BUN 12/03/2014 10  6 - 20 mg/dL Final  . Creatinine, Ser 12/03/2014 0.67  0.44 - 1.00 mg/dL Final  . Calcium 12/03/2014 8.3* 8.9 - 10.3 mg/dL Final  . Total Protein 12/03/2014 7.2  6.5 - 8.1 g/dL  Final  . Albumin 12/03/2014 3.4* 3.5 -  5.0 g/dL Final  . AST 12/03/2014 21  15 - 41 U/L Final  . ALT 12/03/2014 15  14 - 54 U/L Final  . Alkaline Phosphatase 12/03/2014 81  38 - 126 U/L Final  . Total Bilirubin 12/03/2014 0.4  0.3 - 1.2 mg/dL Final  . GFR calc non Af Amer 12/03/2014 >60  >60 mL/min Final  . GFR calc Af Amer 12/03/2014 >60  >60 mL/min Final   Comment: (NOTE) The eGFR has been calculated using the CKD EPI equation. This calculation has not been validated in all clinical situations. eGFR's persistently <60 mL/min signify possible Chronic Kidney Disease.   . Anion gap 12/03/2014 4* 5 - 15 Final  . Magnesium 12/03/2014 2.0  1.7 - 2.4 mg/dL Final    Lab Results  Component Value Date   LABCA2 13.3 04/23/2014   No results found for: CA199 Lab Results  Component Value Date   CEA 2.0 11/07/2014   No results found for: PSA No results found for: CA125   STUDIES: No results found.  ASSESSMENT: 1.  Carcinoma of breast (right) from clinical examination as well as reviewing CT scan it appears that patient has excellent response.  Will continue maintenance Herceptin therapy Possibility of local therapy is going to be discussed 2.  Carcinoma of stomach.  Pathology report has been reviewed.  Significant response to the chemotherapy . MEDICAL DECISION MAKING:  Pathology report of partial gastrectomy is been reviewed. Continue Herceptin MUGA scan of the heart Consider mastectomy for carcinoma of breast Total duration of visit was 45 minutes.  50% or more time was spent in counseling patient and family regarding prognosis and options of treatment and available resources Appointment has been made with Dr. Pat Patrick regarding mastectomy   Patient expressed understanding and was in agreement with this plan. She also understands that She can call clinic at any time with any questions, concerns, or complaints.    Breast cancer   Staging form: Breast, AJCC 7th Edition     Clinical:  Stage IV (T2, N1, M1) - Signed by Forest Gleason, MD on 10/19/2014 Cancer of body of stomach   Staging form: Stomach, AJCC 7th Edition     Clinical: Stage IIA (T3, N0, M0) - Marni Griffon, MD   12/03/2014 5:55 PM

## 2014-12-17 ENCOUNTER — Encounter
Admission: RE | Admit: 2014-12-17 | Discharge: 2014-12-17 | Disposition: A | Discharge status: Home / Self Care | Payer: BLUE CROSS/BLUE SHIELD | Source: Ambulatory Visit | Attending: Oncology | Admitting: Oncology

## 2014-12-17 DIAGNOSIS — C50911 Malignant neoplasm of unspecified site of right female breast: Secondary | ICD-10-CM | POA: Insufficient documentation

## 2014-12-17 MED ORDER — TECHNETIUM TC 99M-LABELED RED BLOOD CELLS IV KIT
20.0000 | PACK | Freq: Once | INTRAVENOUS | Status: AC | PRN
  Administered 2014-12-17: 20.58 via INTRAVENOUS

## 2014-12-18 ENCOUNTER — Telehealth: Payer: Self-pay | Admitting: Surgery

## 2014-12-18 NOTE — Telephone Encounter (Signed)
Patient would like to speak with Amber regarding her FMLA paperwork. She wants to know if everything on our end is complete and have you spoken to [place]? Please call and advise.

## 2014-12-19 NOTE — Telephone Encounter (Signed)
Call made to [facility] once again this morning and I am awaiting a call back from her. Assure patient that this will be taken care of by end of week if she calls back prior to me speaking with [facility].

## 2014-12-23 NOTE — Telephone Encounter (Signed)
This was sent out on Thursday of last week with confirmation that this has been received via fax.

## 2014-12-24 ENCOUNTER — Inpatient Hospital Stay (HOSPITAL_BASED_OUTPATIENT_CLINIC_OR_DEPARTMENT_OTHER): Payer: BLUE CROSS/BLUE SHIELD | Admitting: Oncology

## 2014-12-24 ENCOUNTER — Inpatient Hospital Stay: Payer: BLUE CROSS/BLUE SHIELD | Attending: Oncology

## 2014-12-24 ENCOUNTER — Inpatient Hospital Stay: Payer: BLUE CROSS/BLUE SHIELD

## 2014-12-24 VITALS — BP 121/83 | HR 83 | Temp 96.8°F | Wt 102.7 lb

## 2014-12-24 DIAGNOSIS — M1990 Unspecified osteoarthritis, unspecified site: Secondary | ICD-10-CM | POA: Insufficient documentation

## 2014-12-24 DIAGNOSIS — K219 Gastro-esophageal reflux disease without esophagitis: Secondary | ICD-10-CM | POA: Insufficient documentation

## 2014-12-24 DIAGNOSIS — C50411 Malignant neoplasm of upper-outer quadrant of right female breast: Secondary | ICD-10-CM | POA: Insufficient documentation

## 2014-12-24 DIAGNOSIS — C778 Secondary and unspecified malignant neoplasm of lymph nodes of multiple regions: Secondary | ICD-10-CM | POA: Insufficient documentation

## 2014-12-24 DIAGNOSIS — Z803 Family history of malignant neoplasm of breast: Secondary | ICD-10-CM | POA: Diagnosis not present

## 2014-12-24 DIAGNOSIS — Z5111 Encounter for antineoplastic chemotherapy: Secondary | ICD-10-CM | POA: Diagnosis not present

## 2014-12-24 DIAGNOSIS — F419 Anxiety disorder, unspecified: Secondary | ICD-10-CM

## 2014-12-24 DIAGNOSIS — Z79899 Other long term (current) drug therapy: Secondary | ICD-10-CM

## 2014-12-24 DIAGNOSIS — C169 Malignant neoplasm of stomach, unspecified: Secondary | ICD-10-CM

## 2014-12-24 DIAGNOSIS — C787 Secondary malignant neoplasm of liver and intrahepatic bile duct: Secondary | ICD-10-CM

## 2014-12-24 DIAGNOSIS — Z87891 Personal history of nicotine dependence: Secondary | ICD-10-CM | POA: Insufficient documentation

## 2014-12-24 DIAGNOSIS — C162 Malignant neoplasm of body of stomach: Secondary | ICD-10-CM

## 2014-12-24 DIAGNOSIS — C50911 Malignant neoplasm of unspecified site of right female breast: Secondary | ICD-10-CM

## 2014-12-24 LAB — CBC WITH DIFFERENTIAL/PLATELET
BASOS PCT: 0 %
Basophils Absolute: 0 10*3/uL (ref 0–0.1)
EOS PCT: 1 %
Eosinophils Absolute: 0 10*3/uL (ref 0–0.7)
HEMATOCRIT: 34.8 % — AB (ref 35.0–47.0)
HEMOGLOBIN: 11.2 g/dL — AB (ref 12.0–16.0)
LYMPHS PCT: 29 %
Lymphs Abs: 1.1 10*3/uL (ref 1.0–3.6)
MCH: 28 pg (ref 26.0–34.0)
MCHC: 32.1 g/dL (ref 32.0–36.0)
MCV: 87.1 fL (ref 80.0–100.0)
MONO ABS: 0.3 10*3/uL (ref 0.2–0.9)
Monocytes Relative: 8 %
Neutro Abs: 2.3 10*3/uL (ref 1.4–6.5)
Neutrophils Relative %: 62 %
Platelets: 181 10*3/uL (ref 150–440)
RBC: 3.99 MIL/uL (ref 3.80–5.20)
RDW: 15.8 % — ABNORMAL HIGH (ref 11.5–14.5)
WBC: 3.7 10*3/uL (ref 3.6–11.0)

## 2014-12-24 LAB — COMPREHENSIVE METABOLIC PANEL
ALT: 8 U/L — ABNORMAL LOW (ref 14–54)
AST: 17 U/L (ref 15–41)
Albumin: 3.7 g/dL (ref 3.5–5.0)
Alkaline Phosphatase: 61 U/L (ref 38–126)
Anion gap: 7 (ref 5–15)
BUN: 15 mg/dL (ref 6–20)
CO2: 26 mmol/L (ref 22–32)
Calcium: 8.2 mg/dL — ABNORMAL LOW (ref 8.9–10.3)
Chloride: 98 mmol/L — ABNORMAL LOW (ref 101–111)
Creatinine, Ser: 0.85 mg/dL (ref 0.44–1.00)
GFR calc non Af Amer: >60 mL/min (ref >60)
Glucose, Bld: 140 mg/dL — ABNORMAL HIGH (ref 65–99)
Potassium: 3.7 mmol/L (ref 3.5–5.1)
Sodium: 131 mmol/L — ABNORMAL LOW (ref 135–145)
TOTAL PROTEIN: 6.9 g/dL (ref 6.5–8.1)
Total Bilirubin: 0.4 mg/dL (ref 0.3–1.2)

## 2014-12-24 MED ORDER — LORAZEPAM 0.5 MG PO TABS: 0.5000 mg | ORAL_TABLET | Freq: Every evening | ORAL | Status: DC | PRN

## 2014-12-24 MED ORDER — HEPARIN SOD (PORK) LOCK FLUSH 100 UNIT/ML IV SOLN
500.0000 [IU] | Freq: Once | INTRAVENOUS | Status: AC | PRN
  Administered 2014-12-24: 500 [IU]
  Filled 2014-12-24: qty 5

## 2014-12-24 MED ORDER — DIPHENHYDRAMINE HCL 25 MG PO CAPS
50.0000 mg | ORAL_CAPSULE | Freq: Once | ORAL | Status: AC
  Administered 2014-12-24: 25 mg via ORAL
  Filled 2014-12-24: qty 2

## 2014-12-24 MED ORDER — SODIUM CHLORIDE 0.9 % IV SOLN
Freq: Once | INTRAVENOUS | Status: AC
  Administered 2014-12-24: 16:00:00 via INTRAVENOUS
  Filled 2014-12-24: qty 4

## 2014-12-24 MED ORDER — SODIUM CHLORIDE 0.9 % IV SOLN
Freq: Once | INTRAVENOUS | Status: AC
  Administered 2014-12-24: 15:00:00 via INTRAVENOUS
  Filled 2014-12-24: qty 1000

## 2014-12-24 MED ORDER — LORAZEPAM 2 MG/ML IJ SOLN
0.5000 mg | Freq: Once | INTRAMUSCULAR | Status: AC
  Administered 2014-12-24: 0.5 mg via INTRAVENOUS
  Filled 2014-12-24: qty 1

## 2014-12-24 MED ORDER — ACETAMINOPHEN 325 MG PO TABS
650.0000 mg | ORAL_TABLET | Freq: Once | ORAL | Status: AC
  Administered 2014-12-24: 650 mg via ORAL
  Filled 2014-12-24: qty 2

## 2014-12-24 MED ORDER — SODIUM CHLORIDE 0.9 % IV SOLN
6.0000 mg/kg | Freq: Once | INTRAVENOUS | Status: AC
  Administered 2014-12-24: 315 mg via INTRAVENOUS
  Filled 2014-12-24: qty 15

## 2014-12-24 MED ORDER — CYANOCOBALAMIN 1000 MCG/ML IJ SOLN
1000.0000 ug | Freq: Once | INTRAMUSCULAR | Status: AC
  Administered 2014-12-24: 1000 ug via INTRAMUSCULAR
  Filled 2014-12-24: qty 1

## 2014-12-24 MED ORDER — CITALOPRAM HYDROBROMIDE 20 MG PO TABS: 20.0000 mg | ORAL_TABLET | Freq: Every day | ORAL | Status: DC

## 2014-12-24 NOTE — Progress Notes (Signed)
Patient does not have living will.  Former smoker. 

## 2014-12-25 ENCOUNTER — Telehealth: Payer: Self-pay

## 2014-12-25 NOTE — Telephone Encounter (Signed)
Received phone call from patient stating that [place]ane [place] (Case Manager) for disability claim at [facility] did not receive fax that was sent.   She asked that this be re-faxed to [facility] at [phone].  This was done at this time.

## 2014-12-28 ENCOUNTER — Encounter: Payer: Self-pay | Admitting: Oncology

## 2014-12-28 NOTE — Progress Notes (Signed)
[place] @ [facility] [phone]  Fax:([phone]) [place] [facility]: [birth year]  MR#: [id number]  MWU#:[id number]  Patient Care Team: Sofie Hartigan, MD as PCP - General (Family Medicine) Dia Crawford III, MD (Surgery) Clent Jacks, RN as Registered Nurse  CHIEF COMPLAINT:  Chief Complaint  Patient presents with  . Follow-up    1.  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  Oncology Flowsheet 11/16/2014 11/16/2014 11/16/2014 11/17/2014 11/17/2014 12/03/2014 12/24/2014  ALPRAZolam (XANAX) PO 0.25 mg 0.25 mg   0.25 mg   - -  cyanocobalamin ((VITAMIN B-12)) IM - - - - - - 1,000 mcg  diphenhydrAMINE (BENADRYL) PO - - - - - 50 mg 25 mg  enoxaparin (LOVENOX) Haivana Nakya 40 mg     40 mg   - -  LORazepam (ATIVAN) IV - - - - - - 0.5 mg  ondansetron (ZOFRAN) IV 4 mg 4 mg 4 mg - - - [ 8 mg ]  ondansetron (ZOFRAN) PO 4 mg     4 mg 4 mg - -  promethazine (PHENERGAN) IV - - - - - - -  trastuzumab (HERCEPTIN) IV - - - - - 6 mg/kg 6 mg/kg    INTERVAL HISTORY: 64 year old lady who went upper endoscopy done revealed the stomach tumor to be decrease in size biopsy was negative.  Appetite is improving patient continues to be very apprehensive.  Here to initiate maintenance Herceptin therapy.  No chills.  No fever.  No nausea or vomiting Nov 05, 2014 Patient is here for ongoing evaluation and continuation of Herceptin therapy.  Patient is scheduled for partial gastrectomy on Monday.  No chills.  No fever.  Feeling weak and tired.  Somewhat depressed because of her mother's that recently. June 28: 016 Patient underwent partial gastrectomy.  He tolerated treatment very well.  No evidence of residual adenocarcinoma was found.  Intraoperative ultrasound of the liver revealed only 2 mm hypoechoic area.  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  Patient is  here for ongoing evaluation and treatment consideration.  Pathology report is reviewed.  Patient gradually gaining weight.  No sore breath. july, 2016 Patient is here for ongoing evaluation and treatment consideration. No chills fever.  Continues to lose weight.  Appetite is improving.  Patient remains very anxious.  Here for continuation of Herceptin therapy.  Patient has an appointment to see surgical service in August of 2016  REVIEW OF SYSTEMS:   Gen. status: Patient is somewhat apprehensive but not in any acute distress HEENT: No headache.  No hearing loss.  No ear pain.  No nosebleed or congestion.  No sore t hroat.  No difficulty swallowing  Cardiovascular system: No chest pain.  No palpitation.  No paroxysmal nocturnal dyspnea.  Respiratory system: No cough.  No hemoptysis.  No shortness of breath at rest or exertion.  No chest pain. Abdomen: Abdominal wound is healing well.  No palpable masses. Lower extremities no swelling Skin: No rash Neurological system: No dizziness.  No tingling.  His was.  no tingling numbness.  no focal weakness or any focal signs.  PAST MEDICAL HISTORY: Past Medical History  Diagnosis Date  . Breast cancer   . Arthritis   . GERD (gastroesophageal reflux disease)   . Stomach cancer   . Family history of adverse reaction to anesthesia     mother  had nausea  . Glaucoma     PAST SURGICAL HISTORY: Past Surgical History  Procedure Laterality Date  . Abdominal hysterectomy    . Portacath placement  05/01/2014    Dr. Pat Patrick  . Gastrectomy N/A 11/11/2014    Procedure: Scarlett Presto I, GASTRODUODENOSTOMY, IOUS OF LIVER ;  Surgeon: Molly Maduro, MD;  Location: [facility] ORS;  Service: General;  Laterality: N/A;    FAMILY HISTORY  Significant History/PMH:   Arthritis:    Glaucoma:    Breast Cancer:    GERD - Esophageal Reflux:    Colonoscopy:    Hysterectomy:   Preventive Screening:  Has patient had any of the following test?  Colonscopy  Mammography  Pap Smear (1)   Last Colonoscopy: 2010(1)   Last Mammography: 2015(1)   Last Pap Smear: 2012-hysterectomy in 1970's(1)   Smoking History: Smoking History Cigarettes per day and smoked for 25 years less than a half pack per day; quit 17 years ago(1)  PFSH: Comments: mother had a breast cancer when she was 42 years old.mother sister had breast cancer.  Mother also has stomach cancer  Social History: negative alcohol, negative tobacco  Additional Past Medical and Surgical History: gastroesophageal reflux disease  Arthritis  Hysterectomy.  Ovaries were not removed           ADVANCED DIRECTIVES: Patient does not have any advanced healthcare directive. Information has been given.   HEALTH MAINTENANCE: History  Substance Use Topics  . Smoking status: Former Smoker    Quit date: 07/09/1996  . Smokeless tobacco: Never Used  . Alcohol Use: 1.8 - 2.4 oz/week    3-4 Standard drinks or equivalent per week      Allergies  Allergen Reactions  . Codeine Other (See Comments) and Nausea Only    GI distress    Current Outpatient Prescriptions  Medication Sig Dispense Refill  . acetaminophen (TYLENOL) 500 MG tablet Take 500 mg by mouth every 6 (six) hours as needed for moderate pain or headache.    . ALPRAZolam (XANAX) 0.25 MG tablet Take 0.25 mg by mouth 2 (two) times daily at 8 am and 10 pm.    . brimonidine-timolol (COMBIGAN) 0.2-0.5 % ophthalmic solution Place 1 drop into the left eye 2 (two) times daily.     . citalopram (CELEXA) 20 MG tablet Take 1 tablet (20 mg total) by mouth daily. 90 tablet 3  . LORazepam (ATIVAN) 0.5 MG tablet Take 1 tablet (0.5 mg total) by mouth at bedtime as needed for anxiety. 30 tablet 0  . metoCLOPramide (REGLAN) 10 MG tablet Take 1 tablet (10 mg total) by mouth 2 (two) times daily. 60 tablet 3  . ondansetron (ZOFRAN-ODT) 4 MG disintegrating tablet Take 1 tablet (4 mg total) by mouth every 4 (four) hours as needed for nausea.  30 tablet 3  . ranitidine (ZANTAC) 150 MG capsule Take 1 capsule (150 mg total) by mouth 2 (two) times daily. (Patient taking differently: Take 150 mg by mouth 3 (three) times daily as needed. ) 180 capsule 3  . traMADol (ULTRAM) 50 MG tablet Take 1 tablet (50 mg total) by mouth every 4 (four) hours as needed (Mild pain). 60 tablet 0   No current facility-administered medications for this visit.    OBJECTIVE:  Filed Vitals:   12/24/14 1411  BP: 121/83  Pulse: 83  Temp: 96.8 F (36 C)     Body mass index is 20.74 kg/(m^2).    ECOG FS:1 - Symptomatic but completely ambulatory  PHYSICAL EXAM: Gen. status: Patient is alert oriented not any acute distress slightly apprehensive.  Lymphatic system: No palpable supraclavicular cervical or axillary lymphadenopathy examination of breast: Right breast no palpable mass.  Left breast we have masses. Abdomen: Soft.  Liver and spleen not palpable.  Bowel sounds are present.  Lower extremity no swelling.  Neurological system: Heart functions within normal limit cranial nerves are intact no localizing sign skin: No ecchymosis no rash all other system has been reviewed and examined and they are within normal limit Cardiac exam revealed the PMI to be normally situated and sized. The rhythm was regular and no extrasystoles were noted during several minutes of auscultation. The first and second heart sounds were normal and physiologic splitting of the second heart sound was noted. There were no murmurs, rubs, clicks, or gallops. Examination of the chest was unremarkable. There were no bony deformities, no asymmetry, and no other abnormalities. Examination of the skin revealed no evidence of significant rashes, suspicious appearing nevi or other concerning lesions.  Lower extremity no edema   LAB RESULTS:  Infusion on 12/24/2014  Component Date Value Ref Range Status  . WBC 12/24/2014 3.7  3.6 - 11.0 K/uL Final  . RBC 12/24/2014 3.99  3.80 - 5.20 MIL/uL Final   . Hemoglobin 12/24/2014 11.2* 12.0 - 16.0 g/dL Final  . HCT 12/24/2014 34.8* 35.0 - 47.0 % Final  . MCV 12/24/2014 87.1  80.0 - 100.0 fL Final  . MCH 12/24/2014 28.0  26.0 - 34.0 pg Final  . MCHC 12/24/2014 32.1  32.0 - 36.0 g/dL Final  . RDW 12/24/2014 15.8* 11.5 - 14.5 % Final  . Platelets 12/24/2014 181  150 - 440 K/uL Final  . Neutrophils Relative % 12/24/2014 62   Final  . Neutro Abs 12/24/2014 2.3  1.4 - 6.5 K/uL Final  . Lymphocytes Relative 12/24/2014 29   Final  . Lymphs Abs 12/24/2014 1.1  1.0 - 3.6 K/uL Final  . Monocytes Relative 12/24/2014 8   Final  . Monocytes Absolute 12/24/2014 0.3  0.2 - 0.9 K/uL Final  . Eosinophils Relative 12/24/2014 1   Final  . Eosinophils Absolute 12/24/2014 0.0  0 - 0.7 K/uL Final  . Basophils Relative 12/24/2014 0   Final  . Basophils Absolute 12/24/2014 0.0  0 - 0.1 K/uL Final  . Sodium 12/24/2014 131* 135 - 145 mmol/L Final  . Potassium 12/24/2014 3.7  3.5 - 5.1 mmol/L Final  . Chloride 12/24/2014 98* 101 - 111 mmol/L Final  . CO2 12/24/2014 26  22 - 32 mmol/L Final  . Glucose, Bld 12/24/2014 140* 65 - 99 mg/dL Final  . BUN 12/24/2014 15  6 - 20 mg/dL Final  . Creatinine, Ser 12/24/2014 0.85  0.44 - 1.00 mg/dL Final  . Calcium 12/24/2014 8.2* 8.9 - 10.3 mg/dL Final  . Total Protein 12/24/2014 6.9  6.5 - 8.1 g/dL Final  . Albumin 12/24/2014 3.7  3.5 - 5.0 g/dL Final  . AST 12/24/2014 17  15 - 41 U/L Final  . ALT 12/24/2014 8* 14 - 54 U/L Final  . Alkaline Phosphatase 12/24/2014 61  38 - 126 U/L Final  . Total Bilirubin 12/24/2014 0.4  0.3 - 1.2 mg/dL Final  . GFR calc non Af Amer 12/24/2014 >60  >60 mL/min Final  . GFR calc Af Amer 12/24/2014 >60  >60 mL/min Final   Comment: (NOTE) The eGFR has been calculated using the CKD EPI equation. This calculation has not been validated in all clinical  situations. eGFR's persistently <60 mL/min signify possible Chronic Kidney Disease.   . Anion gap 12/24/2014 7  5 - 15 Final    Lab Results   Component Value Date   LABCA2 13.3 04/23/2014   No results found for: CA199 Lab Results  Component Value Date   CEA 2.0 11/07/2014   No results found for: PSA No results found for: CA125   STUDIES: Nm Cardiac Muga Rest  12/17/2014   CLINICAL DATA:  Breast cancer.  Undergoing chemotherapy.  EXAM: NUCLEAR MEDICINE CARDIAC BLOOD POOL IMAGING (MUGA)  TECHNIQUE: Cardiac multi-gated acquisition was performed at rest following intravenous injection of Tc-15mlabeled red blood cells.  RADIOPHARMACEUTICALS:  20.58 mCi Tc-98mDP in-vitro labeled red blood cells IV  COMPARISON:  08/07/2014  FINDINGS: The left ventricular ejection fraction is equal to 62.2%. Previously this measured 65%. There is normal left ventricular wall motion.  IMPRESSION: 1. Normal left ventricular ejection fraction which is not significantly changed from previous exam.   Electronically Signed   By: TaKerby Moors.D.   On: 12/17/2014 09:30    ASSESSMENT: 1.  Carcinoma of breast (right) from clinical examination as well as reviewing CT scan it appears that patient has excellent response.  Will continue maintenance Herceptin therapy Possibility of local therapy is going to be discussed 2.  Carcinoma of stomach.  Pathology report has been reviewed.  Significant response to the chemotherapy . MEDICAL DECISION MAKING:  Pathology report of partial gastrectomy is been reviewed. Continue Herceptin December 17, 2014 muga  scan of the heart FINDINGS: The left ventricular ejection fraction is equal to 62.2%. Previously this measured 65%. There is normal left ventricular wall motion.  IMPRESSION: 1. Normal left ventricular ejection fraction which is not significantly changed from previous exam. Patient expressed understanding and was in agreement with this plan. She also understands that She can call clinic at any time with any questions, concerns, or complaints.  2.  Proceed with Herceptin maintenance therapy 3.  Patient has an  appointment to see Dr. ElPat Patrickn August of 2016.  Plan to proceed with mastectomy    Breast cancer   Staging form: Breast, AJCC 7th Edition     Clinical: Stage IV (T2, N1, M1) - Signed by JaForest GleasonMD on 10/19/2014 Cancer of body of stomach   Staging form: Stomach, AJCC 7th Edition     Clinical: Stage IIA (T3, N0, M0) - UnMarni GriffonMD   12/28/2014 7:46 AM

## 2015-01-14 ENCOUNTER — Inpatient Hospital Stay: Payer: BLUE CROSS/BLUE SHIELD | Attending: Oncology

## 2015-01-14 ENCOUNTER — Inpatient Hospital Stay: Payer: BLUE CROSS/BLUE SHIELD

## 2015-01-14 ENCOUNTER — Encounter: Payer: Self-pay | Admitting: Oncology

## 2015-01-14 ENCOUNTER — Inpatient Hospital Stay (HOSPITAL_BASED_OUTPATIENT_CLINIC_OR_DEPARTMENT_OTHER): Payer: BLUE CROSS/BLUE SHIELD | Admitting: Oncology

## 2015-01-14 VITALS — BP 138/83 | HR 56 | Temp 97.2°F | Wt 107.0 lb

## 2015-01-14 DIAGNOSIS — C162 Malignant neoplasm of body of stomach: Secondary | ICD-10-CM

## 2015-01-14 DIAGNOSIS — C50411 Malignant neoplasm of upper-outer quadrant of right female breast: Secondary | ICD-10-CM | POA: Diagnosis not present

## 2015-01-14 DIAGNOSIS — Z803 Family history of malignant neoplasm of breast: Secondary | ICD-10-CM | POA: Insufficient documentation

## 2015-01-14 DIAGNOSIS — M1990 Unspecified osteoarthritis, unspecified site: Secondary | ICD-10-CM | POA: Diagnosis not present

## 2015-01-14 DIAGNOSIS — C50911 Malignant neoplasm of unspecified site of right female breast: Secondary | ICD-10-CM

## 2015-01-14 DIAGNOSIS — C778 Secondary and unspecified malignant neoplasm of lymph nodes of multiple regions: Secondary | ICD-10-CM

## 2015-01-14 DIAGNOSIS — Z800 Family history of malignant neoplasm of digestive organs: Secondary | ICD-10-CM | POA: Insufficient documentation

## 2015-01-14 DIAGNOSIS — C787 Secondary malignant neoplasm of liver and intrahepatic bile duct: Secondary | ICD-10-CM

## 2015-01-14 DIAGNOSIS — Z87891 Personal history of nicotine dependence: Secondary | ICD-10-CM | POA: Insufficient documentation

## 2015-01-14 DIAGNOSIS — F329 Major depressive disorder, single episode, unspecified: Secondary | ICD-10-CM

## 2015-01-14 DIAGNOSIS — C169 Malignant neoplasm of stomach, unspecified: Secondary | ICD-10-CM | POA: Insufficient documentation

## 2015-01-14 DIAGNOSIS — K219 Gastro-esophageal reflux disease without esophagitis: Secondary | ICD-10-CM | POA: Insufficient documentation

## 2015-01-14 DIAGNOSIS — Z79899 Other long term (current) drug therapy: Secondary | ICD-10-CM

## 2015-01-14 DIAGNOSIS — Z90710 Acquired absence of both cervix and uterus: Secondary | ICD-10-CM | POA: Insufficient documentation

## 2015-01-14 DIAGNOSIS — Z5111 Encounter for antineoplastic chemotherapy: Secondary | ICD-10-CM | POA: Insufficient documentation

## 2015-01-14 LAB — CBC WITH DIFFERENTIAL/PLATELET
BASOS PCT: 0 %
Basophils Absolute: 0 10*3/uL (ref 0–0.1)
Eosinophils Absolute: 0.1 10*3/uL (ref 0–0.7)
Eosinophils Relative: 1 %
HEMATOCRIT: 30.8 % — AB (ref 35.0–47.0)
Hemoglobin: 10.2 g/dL — ABNORMAL LOW (ref 12.0–16.0)
LYMPHS ABS: 1.3 10*3/uL (ref 1.0–3.6)
Lymphocytes Relative: 33 %
MCH: 28.1 pg (ref 26.0–34.0)
MCHC: 33.1 g/dL (ref 32.0–36.0)
MCV: 84.9 fL (ref 80.0–100.0)
MONO ABS: 0.3 10*3/uL (ref 0.2–0.9)
Monocytes Relative: 8 %
NEUTROS ABS: 2.1 10*3/uL (ref 1.4–6.5)
NEUTROS PCT: 58 %
PLATELETS: 199 10*3/uL (ref 150–440)
RBC: 3.63 MIL/uL — ABNORMAL LOW (ref 3.80–5.20)
RDW: 15.9 % — AB (ref 11.5–14.5)
WBC: 3.8 10*3/uL (ref 3.6–11.0)

## 2015-01-14 LAB — COMPREHENSIVE METABOLIC PANEL
ALBUMIN: 3.6 g/dL (ref 3.5–5.0)
ALT: 10 U/L — AB (ref 14–54)
AST: 15 U/L (ref 15–41)
Alkaline Phosphatase: 55 U/L (ref 38–126)
Anion gap: 4 — ABNORMAL LOW (ref 5–15)
BUN: 20 mg/dL (ref 6–20)
CO2: 28 mmol/L (ref 22–32)
Calcium: 8 mg/dL — ABNORMAL LOW (ref 8.9–10.3)
Chloride: 100 mmol/L — ABNORMAL LOW (ref 101–111)
Creatinine, Ser: 0.82 mg/dL (ref 0.44–1.00)
GFR calc Af Amer: >60 mL/min (ref >60)
GLUCOSE: 90 mg/dL (ref 65–99)
Potassium: 3.5 mmol/L (ref 3.5–5.1)
Sodium: 132 mmol/L — ABNORMAL LOW (ref 135–145)
Total Bilirubin: 0.4 mg/dL (ref 0.3–1.2)
Total Protein: 6.7 g/dL (ref 6.5–8.1)

## 2015-01-14 LAB — MAGNESIUM: Magnesium: 2 mg/dL (ref 1.7–2.4)

## 2015-01-14 MED ORDER — HEPARIN SOD (PORK) LOCK FLUSH 100 UNIT/ML IV SOLN
500.0000 [IU] | Freq: Once | INTRAVENOUS | Status: AC
  Administered 2015-01-14: 500 [IU] via INTRAVENOUS
  Filled 2015-01-14: qty 5

## 2015-01-14 MED ORDER — ACETAMINOPHEN 325 MG PO TABS
650.0000 mg | ORAL_TABLET | Freq: Once | ORAL | Status: AC
  Administered 2015-01-14: 650 mg via ORAL
  Filled 2015-01-14: qty 2

## 2015-01-14 MED ORDER — SODIUM CHLORIDE 0.9 % IV SOLN
Freq: Once | INTRAVENOUS | Status: AC
  Administered 2015-01-14: 15:00:00 via INTRAVENOUS
  Filled 2015-01-14: qty 1000

## 2015-01-14 MED ORDER — SODIUM CHLORIDE 0.9 % IV SOLN
6.0000 mg/kg | Freq: Once | INTRAVENOUS | Status: AC
  Administered 2015-01-14: 315 mg via INTRAVENOUS
  Filled 2015-01-14: qty 15

## 2015-01-14 MED ORDER — SODIUM CHLORIDE 0.9 % IJ SOLN
10.0000 mL | INTRAMUSCULAR | Status: DC | PRN
  Administered 2015-01-14: 10 mL via INTRAVENOUS
  Filled 2015-01-14: qty 10

## 2015-01-14 MED ORDER — DIPHENHYDRAMINE HCL 25 MG PO CAPS
50.0000 mg | ORAL_CAPSULE | Freq: Once | ORAL | Status: AC
  Administered 2015-01-14: 50 mg via ORAL
  Filled 2015-01-14: qty 2

## 2015-01-14 MED ORDER — HEPARIN SOD (PORK) LOCK FLUSH 100 UNIT/ML IV SOLN: 500.0000 [IU] | Freq: Once | INTRAVENOUS | Status: DC | PRN

## 2015-01-14 NOTE — Progress Notes (Signed)
Patient does not have living will.  Former smoker. 

## 2015-01-14 NOTE — Progress Notes (Signed)
[place] @ [facility] [phone]  Fax:([phone]) [place] OB: Aug 21, 1950  MR#: [id number]  UTM#:[id number]  Patient Care Team: Sofie Hartigan, MD as PCP - General (Family Medicine) Dia Crawford III, MD (Surgery) Clent Jacks, RN as Registered Nurse  CHIEF COMPLAINT:  Chief Complaint  Patient presents with  . Follow-up    1.  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  Oncology Flowsheet 11/16/2014 11/16/2014 11/16/2014 11/17/2014 11/17/2014 12/03/2014 12/24/2014  ALPRAZolam (XANAX) PO 0.25 mg 0.25 mg   0.25 mg   - -  cyanocobalamin ((VITAMIN B-12)) IM - - - - - - 1,000 mcg  diphenhydrAMINE (BENADRYL) PO - - - - - 50 mg 25 mg  enoxaparin (LOVENOX) [place]ine 40 mg     40 mg   - -  LORazepam (ATIVAN) IV - - - - - - 0.5 mg  ondansetron (ZOFRAN) IV 4 mg 4 mg 4 mg - - - [ 8 mg ]  ondansetron (ZOFRAN) PO 4 mg     4 mg 4 mg - -  promethazine (PHENERGAN) IV - - - - - - -  trastuzumab (HERCEPTIN) IV - - - - - 6 mg/kg 6 mg/kg    INTERVAL HISTORY: 64 year old lady who went upper endoscopy done revealed the stomach tumor to be decrease in size biopsy was negative.  Appetite is improving patient continues to be very apprehensive.  Here to initiate maintenance Herceptin therapy.  No chills.  No fever.  No nausea or vomiting Nov 05, 2014 Patient is here for ongoing evaluation and continuation of Herceptin therapy.  Patient is scheduled for partial gastrectomy on Monday.  No chills.  No fever.  Feeling weak and tired.  Somewhat depressed because of her mother's that recently. June 28: 016 Patient underwent partial gastrectomy.  He tolerated treatment very well.  No evidence of residual adenocarcinoma was found.  Intraoperative ultrasound of the liver revealed only 2 mm hypoechoic area.  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  Patient is  here for ongoing evaluation and treatment consideration.  Pathology report is reviewed.  Patient gradually gaining weight.  No sore breath. july, 2016 Patient is here for ongoing evaluation and treatment consideration. No chills fever.  Continues to lose weight.  Appetite is improving.  Patient remains very anxious.  Here for continuation of Herceptin therapy.  Patient has an appointment to see surgical service in August of 2016 August 9 [facility], 2016  Patient is here for continuation of Herceptin therapy.  Since last evaluation patient is again 6 pound weight appetite is improving.  Patient is due to be evaluated by Dr. Bronson Ing for surgical intervention for possible mastectomy and exhibiting no dissection. Feeling better.  Less depressed.  No nausea no vomiting no diarrhea. REVIEW OF SYSTEMS:   Gen. status: Patient is somewhat apprehensive but not in any acute distress HEENT: No headache.  No hearing loss.  No ear pain.  No nosebleed or congestion.  No sore t hroat.  No difficulty swallowing  Cardiovascular system: No chest pain.  No palpitation.  No paroxysmal nocturnal dyspnea.  Respiratory system: No cough.  No hemoptysis.  No shortness of breath at rest or exertion.  No chest pain. Abdomen: Abdominal wound is healing well.  No palpable masses. Lower extremities no swelling Skin: No rash Neurological system: No dizziness.  No tingling.  His  was.  no tingling numbness.  no focal weakness or any focal signs.  PAST MEDICAL HISTORY: Past Medical History  Diagnosis Date  . Breast cancer   . Arthritis   . GERD (gastroesophageal reflux disease)   . Stomach cancer   . Family history of adverse reaction to anesthesia     mother had nausea  . Glaucoma     PAST SURGICAL HISTORY: Past Surgical History  Procedure Laterality Date  . Abdominal hysterectomy    . Portacath placement  05/01/2014    Dr. Pat Patrick  . Gastrectomy N/A 11/11/2014    Procedure: Scarlett Presto I, GASTRODUODENOSTOMY,  IOUS OF LIVER ;  Surgeon: Molly Maduro, MD;  Location: [facility] ORS;  Service: General;  Laterality: N/A;    FAMILY HISTORY  Significant History/PMH:   Arthritis:    Glaucoma:    Breast Cancer:    GERD - Esophageal Reflux:    Colonoscopy:    Hysterectomy:   Preventive Screening:  Has patient had any of the following test? Colonscopy  Mammography  Pap Smear (1)   Last Colonoscopy: 2010(1)   Last Mammography: 2015(1)   Last Pap Smear: 2012-hysterectomy in 1970's(1)   Smoking History: Smoking History Cigarettes per day and smoked for 25 years less than a half pack per day; quit 17 years ago(1)  PFSH: Comments: mother had a breast cancer when she was 57 years old.mother sister had breast cancer.  Mother also has stomach cancer  Social History: negative alcohol, negative tobacco  Additional Past Medical and Surgical History: gastroesophageal reflux disease  Arthritis  Hysterectomy.  Ovaries were not removed           ADVANCED DIRECTIVES: Patient does not have any advanced healthcare directive. Information has been given.   HEALTH MAINTENANCE: History  Substance Use Topics  . Smoking status: Former Smoker    Quit date: 07/09/1996  . Smokeless tobacco: Never Used  . Alcohol Use: 1.8 - 2.4 oz/week    3-4 Standard drinks or equivalent per week      Allergies  Allergen Reactions  . Codeine Other (See Comments) and Nausea Only    GI distress    Current Outpatient Prescriptions  Medication Sig Dispense Refill  . acetaminophen (TYLENOL) 500 MG tablet Take 500 mg by mouth every 6 (six) hours as needed for moderate pain or headache.    . ALPRAZolam (XANAX) 0.25 MG tablet Take 0.25 mg by mouth 2 (two) times daily at 8 am and 10 pm.    . brimonidine-timolol (COMBIGAN) 0.2-0.5 % ophthalmic solution Place 1 drop into the left eye 2 (two) times daily.     . citalopram (CELEXA) 20 MG tablet Take 1 tablet (20 mg total) by mouth daily. 90 tablet 3  . LORazepam  (ATIVAN) 0.5 MG tablet Take 1 tablet (0.5 mg total) by mouth at bedtime as needed for anxiety. 30 tablet 0  . metoCLOPramide (REGLAN) 10 MG tablet Take 1 tablet (10 mg total) by mouth 2 (two) times daily. 60 tablet 3  . ondansetron (ZOFRAN-ODT) 4 MG disintegrating tablet Take 1 tablet (4 mg total) by mouth every 4 (four) hours as needed for nausea. 30 tablet 3  . ranitidine (ZANTAC) 150 MG capsule Take 1 capsule (150 mg total) by mouth 2 (two) times daily. (Patient taking differently: Take 150 mg by mouth 3 (three) times daily as needed. ) 180 capsule 3  . traMADol (ULTRAM) 50 MG tablet Take 1 tablet (50 mg total) by mouth every 4 (four) hours as needed (  Mild pain). 60 tablet 0  . omeprazole (PRILOSEC) 40 MG capsule Take 40 mg by mouth 2 (two) times daily.  2   No current facility-administered medications for this visit.   Facility-Administered Medications Ordered in Other Visits  Medication Dose Route Frequency Provider Last Rate Last Dose  . heparin lock flush 100 unit/mL  500 Units Intravenous Once Forest Gleason, MD      . sodium chloride 0.9 % injection 10 mL  10 mL Intravenous PRN Forest Gleason, MD   10 mL at 01/14/15 1337    OBJECTIVE:  Filed Vitals:   01/14/15 1358  BP: 138/83  Pulse: 56  Temp: 97.2 F (36.2 C)     Body mass index is 21.6 kg/(m^2).    ECOG FS:1 - Symptomatic but completely ambulatory  PHYSICAL EXAM: Gen. status: Patient is alert oriented not any acute distress slightly apprehensive.  Lymphatic system: No palpable supraclavicular cervical or axillary lymphadenopathy examination of breast: Right breast no palpable mass.  Left breast we have masses. Abdomen: Soft.  Liver and spleen not palpable.  Bowel sounds are present.  Lower extremity no swelling.  Neurological system: Heart functions within normal limit cranial nerves are intact no localizing sign skin: No ecchymosis no rash all other system has been reviewed and examined and they are within normal limit Cardiac  exam revealed the PMI to be normally situated and sized. The rhythm was regular and no extrasystoles were noted during several minutes of auscultation. The first and second heart sounds were normal and physiologic splitting of the second heart sound was noted. There were no murmurs, rubs, clicks, or gallops. Examination of the chest was unremarkable. There were no bony deformities, no asymmetry, and no other abnormalities. Examination of the skin revealed no evidence of significant rashes, suspicious appearing nevi or other concerning lesions.  Lower extremity no edema   LAB RESULTS:  Infusion on 01/14/2015  Component Date Value Ref Range Status  . WBC 01/14/2015 3.8  3.6 - 11.0 K/uL Final   A-LINE DRAW  . RBC 01/14/2015 3.63* 3.80 - 5.20 MIL/uL Final  . Hemoglobin 01/14/2015 10.2* 12.0 - 16.0 g/dL Final  . HCT 01/14/2015 30.8* 35.0 - 47.0 % Final  . MCV 01/14/2015 84.9  80.0 - 100.0 fL Final  . MCH 01/14/2015 28.1  26.0 - 34.0 pg Final  . MCHC 01/14/2015 33.1  32.0 - 36.0 g/dL Final  . RDW 01/14/2015 15.9* 11.5 - 14.5 % Final  . Platelets 01/14/2015 199  150 - 440 K/uL Final  . Neutrophils Relative % 01/14/2015 58   Final  . Neutro Abs 01/14/2015 2.1  1.4 - 6.5 K/uL Final  . Lymphocytes Relative 01/14/2015 33   Final  . Lymphs Abs 01/14/2015 1.3  1.0 - 3.6 K/uL Final  . Monocytes Relative 01/14/2015 8   Final  . Monocytes Absolute 01/14/2015 0.3  0.2 - 0.9 K/uL Final  . Eosinophils Relative 01/14/2015 1   Final  . Eosinophils Absolute 01/14/2015 0.1  0 - 0.7 K/uL Final  . Basophils Relative 01/14/2015 0   Final  . Basophils Absolute 01/14/2015 0.0  0 - 0.1 K/uL Final  . Sodium 01/14/2015 132* 135 - 145 mmol/L Final  . Potassium 01/14/2015 3.5  3.5 - 5.1 mmol/L Final  . Chloride 01/14/2015 100* 101 - 111 mmol/L Final  . CO2 01/14/2015 28  22 - 32 mmol/L Final  . Glucose, Bld 01/14/2015 90  65 - 99 mg/dL Final  . BUN 01/14/2015 20  6 -  20 mg/dL Final  . Creatinine, Ser 01/14/2015  0.82  0.44 - 1.00 mg/dL Final  . Calcium 01/14/2015 8.0* 8.9 - 10.3 mg/dL Final  . Total Protein 01/14/2015 6.7  6.5 - 8.1 g/dL Final  . Albumin 01/14/2015 3.6  3.5 - 5.0 g/dL Final  . AST 01/14/2015 15  15 - 41 U/L Final  . ALT 01/14/2015 10* 14 - 54 U/L Final  . Alkaline Phosphatase 01/14/2015 55  38 - 126 U/L Final  . Total Bilirubin 01/14/2015 0.4  0.3 - 1.2 mg/dL Final  . GFR calc non Af Amer 01/14/2015 >60  >60 mL/min Final  . GFR calc Af Amer 01/14/2015 >60  >60 mL/min Final   Comment: (NOTE) The eGFR has been calculated using the CKD EPI equation. This calculation has not been validated in all clinical situations. eGFR's persistently <60 mL/min signify possible Chronic Kidney Disease.   . Anion gap 01/14/2015 4* 5 - 15 Final  . Magnesium 01/14/2015 2.0  1.7 - 2.4 mg/dL Final    Lab Results  Component Value Date   LABCA2 13.3 04/23/2014   No results found for: CA199 Lab Results  Component Value Date   CEA 2.0 11/07/2014   No results found for: PSA No results found for: CA125   STUDIES: Nm Cardiac Muga Rest  12/17/2014   CLINICAL DATA:  Breast cancer.  Undergoing chemotherapy.  EXAM: NUCLEAR MEDICINE CARDIAC BLOOD POOL IMAGING (MUGA)  TECHNIQUE: Cardiac multi-gated acquisition was performed at rest following intravenous injection of Tc-62mlabeled red blood cells.  RADIOPHARMACEUTICALS:  20.58 mCi Tc-968mDP in-vitro labeled red blood cells IV  COMPARISON:  08/07/2014  FINDINGS: The left ventricular ejection fraction is equal to 62.2%. Previously this measured 65%. There is normal left ventricular wall motion.  IMPRESSION: 1. Normal left ventricular ejection fraction which is not significantly changed from previous exam.   Electronically Signed   By: TaKerby Moors.D.   On: 12/17/2014 09:30    ASSESSMENT: 1.  Carcinoma of breast (right) from clinical examination as well as reviewing CT scan it appears that patient has excellent response.  Will continue maintenance  Herceptin therapy Possibility of local therapy is going to be discussed 2.  Carcinoma of stomach.  Pathology report has been reviewed.  Significant response to the chemotherapy .. Marland KitchenLast MUGA scan was in July of 2016 reported to have ejection fraction of 62% MEDICAL DECISION MAKING:  Pathology report of partial gastrectomy is been reviewed. Continue Herceptin PATIENT   would be referred to surgical service for possibility of mastectomy December 17, 2014 muga  scan of the heart FINDINGS: The left ventricular ejection fraction is equal to 62.2%. Previously this measured 65%. There is normal left ventricular wall motion.  IMPRESSION: 1. Normal left ventricular ejection fraction which is not significantly changed from previous exam. Patient expressed understanding and was in agreement with this plan. She also understands that She can call clinic at any time with any questions, concerns, or complaints.  2.  Proceed with Herceptin maintenance therapy 3.  Patient has an appointment to see Dr. ElPat Patrickn August of 2016.  Plan to proceed with mastectomy    Breast cancer   Staging form: Breast, AJCC 7th Edition     Clinical: Stage IV (T2, N1, M1) - Signed by JaForest GleasonMD on 10/19/2014 Cancer of body of stomach   Staging form: Stomach, AJCC 7th Edition     Clinical: Stage IIA (T3, N0, M0) - Unsigned   JaForest GleasonMD  01/14/2015 2:20 PM

## 2015-01-23 ENCOUNTER — Ambulatory Visit: Payer: BLUE CROSS/BLUE SHIELD | Admitting: Surgery

## 2015-01-24 ENCOUNTER — Ambulatory Visit (INDEPENDENT_AMBULATORY_CARE_PROVIDER_SITE_OTHER): Payer: BLUE CROSS/BLUE SHIELD | Admitting: Surgery

## 2015-01-24 ENCOUNTER — Encounter: Payer: Self-pay | Admitting: Surgery

## 2015-01-24 VITALS — BP 145/78 | HR 57 | Temp 98.3°F | Ht 59.0 in | Wt 108.6 lb

## 2015-01-24 DIAGNOSIS — C773 Secondary and unspecified malignant neoplasm of axilla and upper limb lymph nodes: Secondary | ICD-10-CM

## 2015-01-24 DIAGNOSIS — C50911 Malignant neoplasm of unspecified site of right female breast: Secondary | ICD-10-CM

## 2015-01-24 MED ORDER — TRAMADOL HCL 50 MG PO TABS
50.0000 mg | ORAL_TABLET | ORAL | Status: DC | PRN
Start: 2015-01-24 — End: 2015-03-25

## 2015-01-24 NOTE — Patient Instructions (Signed)
Please see your surgical care information [facility] Sheet) that you were given.

## 2015-01-24 NOTE — Progress Notes (Signed)
Surgical Consultation  01/24/2015  Kimberly Thornton is an 64 y.o. female.   Chief Complaint  Patient presents with  . Breast Cancer     HPI: She has recovered from her partial gastrectomy and is now planning her mastectomy. She is followed up with the oncologist who have recommended we proceed with right mastectomy. She has biopsy-proven axillary disease and is being treated with Herceptin.  She has gained some weight and is feeling much better following her stomach surgery. She would like to have the mastectomy as soon as possible. She has no new symptoms.  Past Medical History  Diagnosis Date  . Breast cancer   . Arthritis   . GERD (gastroesophageal reflux disease)   . Stomach cancer   . Family history of adverse reaction to anesthesia     mother had nausea  . Glaucoma     Past Surgical History  Procedure Laterality Date  . Abdominal hysterectomy    . Portacath placement  05/01/2014    Dr. Pat Patrick  . Gastrectomy N/A 11/11/2014    Procedure: Scarlett Presto I, GASTRODUODENOSTOMY, IOUS OF LIVER ;  Surgeon: Molly Maduro, MD;  Location: [facility] ORS;  Service: General;  Laterality: N/A;    Family History  Problem Relation Age of Onset  . Breast cancer Mother   . Stomach cancer Mother 17  . Lung cancer Father   . Heart attack Father   . Hypertension Father     Social History:  reports that she quit smoking about 18 years ago. She has never used smokeless tobacco. She reports that she drinks about 1.8 - 2.4 oz of alcohol per week. She reports that she does not use illicit drugs.  Allergies:  Allergies  Allergen Reactions  . Codeine Other (See Comments) and Nausea Only    GI distress    Medications reviewed.     Review of Systems  Constitutional: Positive for weight loss and malaise/fatigue. Negative for fever and chills.  HENT: Negative.   Eyes: Negative.   Respiratory: Negative for cough and shortness of breath.   Cardiovascular: Negative.    Gastrointestinal: Positive for heartburn. Negative for nausea, vomiting and abdominal pain.  Genitourinary: Negative.   Musculoskeletal: Negative.   Skin: Negative.   Neurological: Negative.   Psychiatric/Behavioral: Negative.        BP 145/78 mmHg  Pulse 57  Temp(Src) 98.3 F (36.8 C) (Oral)  Ht 4\' 11"  (1.499 m)  Wt 108 lb 9.6 oz (49.261 kg)  BMI 21.92 kg/m2  Physical Exam  Constitutional: She is oriented to person, place, and time and well-developed, well-nourished, and in no distress.  HENT:  Head: Normocephalic and atraumatic.  Eyes: Conjunctivae are normal. Pupils are equal, round, and reactive to light.  Neck: Normal range of motion. Neck supple.  Cardiovascular: Regular rhythm and normal heart sounds.   Pulmonary/Chest: Effort normal and breath sounds normal.  Abdominal: Soft. Bowel sounds are normal.  Musculoskeletal: Normal range of motion. She exhibits no tenderness.  Neurological: She is alert and oriented to person, place, and time.  Skin: Skin is warm and dry.  Psychiatric: Memory and judgment normal.      No results found for this or any previous visit (from the past 48 hour(s)). No results found.  Assessment/Plan: 1. Breast cancer metastasized to axillary lymph node, right We plan a right mastectomy for local control of her known disease currently under treatment with Herceptin. Risks benefits and options been outlined and accepted. She is in agreement.  Dia Crawford III dermatitis

## 2015-01-27 ENCOUNTER — Telehealth: Payer: Self-pay | Admitting: Surgery

## 2015-01-27 NOTE — Telephone Encounter (Signed)
Pt advised of pre op date/time and sx date. Sx: 02/04/15 with Dr Mickle Mallory Simple Mastectomy Pre op: 01/30/15 @ 2:00pm.  Observation.

## 2015-01-28 ENCOUNTER — Other Ambulatory Visit: Payer: BLUE CROSS/BLUE SHIELD

## 2015-01-28 ENCOUNTER — Telehealth: Payer: Self-pay | Admitting: *Deleted

## 2015-01-28 NOTE — Telephone Encounter (Signed)
She is scheduled for Herceptin on Tuesday and now Dr Pat Patrick has scheduled her for mastectomy that same day. She is asking if it will affect her outcome to postpone Herceptin for a week or whatever you decide is appropriate.  She is requesting a refill on her alprazolam also

## 2015-01-29 MED ORDER — ALPRAZOLAM 0.25 MG PO TABS: 0.2500 mg | ORAL_TABLET | Freq: Two times a day (BID) | ORAL | Status: DC

## 2015-01-29 NOTE — Telephone Encounter (Signed)
Called and let pt know that her Xanax has been called in and that she should proceed with surgery and we will discuss with Dr Jeb Levering when to reschedule her Herceptin appt

## 2015-01-30 ENCOUNTER — Encounter
Admission: RE | Admit: 2015-01-30 | Discharge: 2015-01-30 | Disposition: A | Discharge status: Home / Self Care | Payer: BLUE CROSS/BLUE SHIELD | Source: Ambulatory Visit | Attending: Surgery | Admitting: Surgery

## 2015-01-30 DIAGNOSIS — Z01818 Encounter for other preprocedural examination: Secondary | ICD-10-CM | POA: Diagnosis not present

## 2015-01-30 DIAGNOSIS — C50911 Malignant neoplasm of unspecified site of right female breast: Secondary | ICD-10-CM | POA: Diagnosis not present

## 2015-01-30 HISTORY — DX: Anxiety disorder, unspecified: F41.9

## 2015-01-30 NOTE — Patient Instructions (Signed)
  Your procedure is scheduled on: Tuesday 02/04/2015 Report to Day Surgery. 2ND FLOOR MEDICAL MALL ENTRANCE To find out your arrival time please call [phone] between 1PM - 3PM on Monday 02/03/2015.  Remember: Instructions that are not followed completely may result in serious medical risk, up to and including death, or upon the discretion of your surgeon and anesthesiologist your surgery may need to be rescheduled.    __X__ 1. Do not eat food or drink liquids after midnight. No gum chewing or hard candies.     __X__ 2. No Alcohol for 24 hours before or after surgery.   ____ 3. Bring all medications with you on the day of surgery if instructed.    __X__ 4. Notify your doctor if there is any change in your medical condition     (cold, fever, infections).     Do not wear jewelry, make-up, hairpins, clips or nail polish.  Do not wear lotions, powders, or perfumes. You may wear deodorant.  Do not shave 48 hours prior to surgery. Men may shave face and neck.  Do not bring valuables to the hospital.    [facility] is not responsible for any belongings or valuables.               Contacts, dentures or bridgework may not be worn into surgery.  Leave your suitcase in the car. After surgery it may be brought to your room.  For patients admitted to the hospital, discharge time is determined by your                treatment team.   Patients discharged the day of surgery will not be allowed to drive home.   Please read over the following fact sheets that you were given:   Surgical Site Infection Prevention   __X_ Take these medicines the morning of surgery with A SIP OF WATER:    1. XANAX  2. OMEPRAZOLE  3. RANITIDAINE  4. METOCLOPRAMIDE  5.  6.  ____ Fleet Enema (as directed)   __X__ Use CHG Soap as directed  ____ Use inhalers on the day of surgery  ____ Stop metformin 2 days prior to surgery    ____ Take 1/2 of usual insulin dose the night before surgery and none on the  morning of surgery.   ____ Stop Coumadin/Plavix/aspirin on   ____ Stop Anti-inflammatories on    ____ Stop supplements until after surgery.    ____ Bring C-Pap to the hospital.

## 2015-01-30 NOTE — Telephone Encounter (Signed)
Reschedule appt for 1 week after surgery per Dr Oliva Bustard. I have left msg for pt to return my call

## 2015-02-04 ENCOUNTER — Ambulatory Visit: Payer: BLUE CROSS/BLUE SHIELD

## 2015-02-04 ENCOUNTER — Ambulatory Visit: Payer: BLUE CROSS/BLUE SHIELD | Admitting: Oncology

## 2015-02-04 ENCOUNTER — Ambulatory Visit: Payer: BLUE CROSS/BLUE SHIELD | Admitting: Anesthesiology

## 2015-02-04 ENCOUNTER — Encounter: Payer: Self-pay | Admitting: *Deleted

## 2015-02-04 ENCOUNTER — Observation Stay
Admission: RE | Admit: 2015-02-04 | Discharge: 2015-02-05 | Disposition: A | Discharge status: Home / Self Care | Payer: BLUE CROSS/BLUE SHIELD | Source: Ambulatory Visit | Attending: Surgery | Admitting: Surgery

## 2015-02-04 ENCOUNTER — Encounter
Admission: RE | Disposition: A | Discharge status: Home / Self Care | Payer: Self-pay | Source: Ambulatory Visit | Attending: Surgery

## 2015-02-04 ENCOUNTER — Other Ambulatory Visit: Payer: BLUE CROSS/BLUE SHIELD

## 2015-02-04 DIAGNOSIS — C50919 Malignant neoplasm of unspecified site of unspecified female breast: Secondary | ICD-10-CM | POA: Diagnosis present

## 2015-02-04 DIAGNOSIS — M1990 Unspecified osteoarthritis, unspecified site: Secondary | ICD-10-CM | POA: Insufficient documentation

## 2015-02-04 DIAGNOSIS — C50911 Malignant neoplasm of unspecified site of right female breast: Principal | ICD-10-CM | POA: Insufficient documentation

## 2015-02-04 DIAGNOSIS — K219 Gastro-esophageal reflux disease without esophagitis: Secondary | ICD-10-CM | POA: Insufficient documentation

## 2015-02-04 DIAGNOSIS — Z79899 Other long term (current) drug therapy: Secondary | ICD-10-CM | POA: Insufficient documentation

## 2015-02-04 DIAGNOSIS — E440 Moderate protein-calorie malnutrition: Secondary | ICD-10-CM | POA: Insufficient documentation

## 2015-02-04 DIAGNOSIS — Z87891 Personal history of nicotine dependence: Secondary | ICD-10-CM | POA: Diagnosis not present

## 2015-02-04 HISTORY — PX: SIMPLE MASTECTOMY WITH AXILLARY SENTINEL NODE BIOPSY: SHX6098

## 2015-02-04 LAB — CBC
HEMATOCRIT: 32 % — AB (ref 35.0–47.0)
HEMOGLOBIN: 10.3 g/dL — AB (ref 12.0–16.0)
MCH: 27.1 pg (ref 26.0–34.0)
MCHC: 32.1 g/dL (ref 32.0–36.0)
MCV: 84.3 fL (ref 80.0–100.0)
Platelets: 164 10*3/uL (ref 150–440)
RBC: 3.8 MIL/uL (ref 3.80–5.20)
RDW: 15.9 % — ABNORMAL HIGH (ref 11.5–14.5)
WBC: 5 10*3/uL (ref 3.6–11.0)

## 2015-02-04 LAB — CREATININE, SERUM
CREATININE: 0.84 mg/dL (ref 0.44–1.00)
GFR calc Af Amer: >60 mL/min (ref >60)
GFR calc non Af Amer: >60 mL/min (ref >60)

## 2015-02-04 SURGERY — SIMPLE MASTECTOMY
Anesthesia: General | Laterality: Right | Wound class: Clean

## 2015-02-04 MED ORDER — CEFAZOLIN SODIUM-DEXTROSE 2-3 GM-% IV SOLR
2.0000 g | INTRAVENOUS | Status: AC
  Administered 2015-02-04: 2 g via INTRAVENOUS

## 2015-02-04 MED ORDER — MIDAZOLAM HCL 2 MG/2ML IJ SOLN
INTRAMUSCULAR | Status: DC | PRN
  Administered 2015-02-04: 1 mg via INTRAVENOUS

## 2015-02-04 MED ORDER — ACETAMINOPHEN 10 MG/ML IV SOLN
INTRAVENOUS | Status: DC | PRN
  Administered 2015-02-04: 1000 mg via INTRAVENOUS

## 2015-02-04 MED ORDER — FAMOTIDINE 20 MG PO TABS
20.0000 mg | ORAL_TABLET | Freq: Every day | ORAL | Status: DC
  Administered 2015-02-04 – 2015-02-05 (×2): 20 mg via ORAL
  Filled 2015-02-04 (×2): qty 1

## 2015-02-04 MED ORDER — EPHEDRINE SULFATE 50 MG/ML IJ SOLN
INTRAMUSCULAR | Status: DC | PRN
  Administered 2015-02-04: 10 mg via INTRAVENOUS
  Administered 2015-02-04: 5 mg via INTRAVENOUS

## 2015-02-04 MED ORDER — ALPRAZOLAM 0.5 MG PO TABS
0.2500 mg | ORAL_TABLET | Freq: Two times a day (BID) | ORAL | Status: DC
  Administered 2015-02-04 – 2015-02-05 (×3): 0.25 mg via ORAL
  Filled 2015-02-04 (×3): qty 1

## 2015-02-04 MED ORDER — CHLORHEXIDINE GLUCONATE 4 % EX LIQD: 1.0000 "application " | Freq: Once | CUTANEOUS | Status: DC

## 2015-02-04 MED ORDER — FENTANYL CITRATE (PF) 100 MCG/2ML IJ SOLN: 25.0000 ug | INTRAMUSCULAR | Status: DC | PRN

## 2015-02-04 MED ORDER — ONDANSETRON HCL 4 MG/2ML IJ SOLN
INTRAMUSCULAR | Status: DC | PRN
  Administered 2015-02-04: 4 mg via INTRAVENOUS

## 2015-02-04 MED ORDER — ONDANSETRON HCL 4 MG/2ML IJ SOLN: 4.0000 mg | Freq: Once | INTRAMUSCULAR | Status: DC | PRN

## 2015-02-04 MED ORDER — ACETAMINOPHEN 10 MG/ML IV SOLN
INTRAVENOUS | Status: AC
  Filled 2015-02-04: qty 100

## 2015-02-04 MED ORDER — LIDOCAINE HCL (CARDIAC) 20 MG/ML IV SOLN
INTRAVENOUS | Status: DC | PRN
  Administered 2015-02-04: 100 mg via INTRAVENOUS

## 2015-02-04 MED ORDER — NEOSTIGMINE METHYLSULFATE 10 MG/10ML IV SOLN
INTRAVENOUS | Status: DC | PRN
  Administered 2015-02-04: 3 mg via INTRAVENOUS

## 2015-02-04 MED ORDER — ENOXAPARIN SODIUM 40 MG/0.4ML ~~LOC~~ SOLN
40.0000 mg | SUBCUTANEOUS | Status: DC
  Administered 2015-02-05: 40 mg via SUBCUTANEOUS
  Filled 2015-02-04: qty 0.4

## 2015-02-04 MED ORDER — ACETAMINOPHEN 325 MG PO TABS
650.0000 mg | ORAL_TABLET | Freq: Four times a day (QID) | ORAL | Status: DC | PRN
  Administered 2015-02-04: 650 mg via ORAL
  Filled 2015-02-04: qty 2

## 2015-02-04 MED ORDER — DEXAMETHASONE SODIUM PHOSPHATE 4 MG/ML IJ SOLN
INTRAMUSCULAR | Status: DC | PRN
  Administered 2015-02-04: 5 mg via INTRAVENOUS

## 2015-02-04 MED ORDER — PHENYLEPHRINE HCL 10 MG/ML IJ SOLN
INTRAMUSCULAR | Status: DC | PRN
  Administered 2015-02-04: 100 ug via INTRAVENOUS

## 2015-02-04 MED ORDER — ACETAMINOPHEN 650 MG RE SUPP: 650.0000 mg | Freq: Four times a day (QID) | RECTAL | Status: DC | PRN

## 2015-02-04 MED ORDER — MORPHINE SULFATE (PF) 2 MG/ML IV SOLN
2.0000 mg | INTRAVENOUS | Status: DC | PRN
  Administered 2015-02-05: 2 mg via INTRAVENOUS
  Filled 2015-02-04: qty 1

## 2015-02-04 MED ORDER — LACTATED RINGERS IV SOLN
INTRAVENOUS | Status: DC
  Administered 2015-02-04 – 2015-02-05 (×4): via INTRAVENOUS

## 2015-02-04 MED ORDER — TRAMADOL HCL 50 MG PO TABS
50.0000 mg | ORAL_TABLET | ORAL | Status: DC | PRN
  Administered 2015-02-04 – 2015-02-05 (×3): 50 mg via ORAL
  Filled 2015-02-04 (×3): qty 1

## 2015-02-04 MED ORDER — LORAZEPAM 0.5 MG PO TABS
0.5000 mg | ORAL_TABLET | Freq: Every evening | ORAL | Status: DC | PRN
  Administered 2015-02-04: 0.5 mg via ORAL
  Filled 2015-02-04: qty 1

## 2015-02-04 MED ORDER — FENTANYL CITRATE (PF) 100 MCG/2ML IJ SOLN
INTRAMUSCULAR | Status: AC
  Filled 2015-02-04: qty 2

## 2015-02-04 MED ORDER — BRIMONIDINE TARTRATE-TIMOLOL 0.2-0.5 % OP SOLN
1.0000 [drp] | Freq: Two times a day (BID) | OPHTHALMIC | Status: DC
  Administered 2015-02-04: 1 [drp] via OPHTHALMIC
  Filled 2015-02-04: qty 5

## 2015-02-04 MED ORDER — GLYCOPYRROLATE 0.2 MG/ML IJ SOLN
INTRAMUSCULAR | Status: DC | PRN
  Administered 2015-02-04: 0.4 mg via INTRAVENOUS
  Administered 2015-02-04: 0.2 mg via INTRAVENOUS

## 2015-02-04 MED ORDER — FENTANYL CITRATE (PF) 100 MCG/2ML IJ SOLN
INTRAMUSCULAR | Status: DC | PRN
  Administered 2015-02-04 (×2): 100 ug via INTRAVENOUS
  Administered 2015-02-04: 50 ug via INTRAVENOUS

## 2015-02-04 MED ORDER — ENOXAPARIN SODIUM 40 MG/0.4ML ~~LOC~~ SOLN
40.0000 mg | Freq: Once | SUBCUTANEOUS | Status: AC
  Administered 2015-02-04: 40 mg via SUBCUTANEOUS
  Filled 2015-02-04: qty 0.4

## 2015-02-04 MED ORDER — CITALOPRAM HYDROBROMIDE 20 MG PO TABS
20.0000 mg | ORAL_TABLET | Freq: Every day | ORAL | Status: DC
  Administered 2015-02-04: 20 mg via ORAL
  Filled 2015-02-04: qty 1

## 2015-02-04 MED ORDER — CEFAZOLIN SODIUM-DEXTROSE 2-3 GM-% IV SOLR
INTRAVENOUS | Status: AC
  Filled 2015-02-04: qty 50

## 2015-02-04 MED ORDER — ROCURONIUM BROMIDE 100 MG/10ML IV SOLN
INTRAVENOUS | Status: DC | PRN
  Administered 2015-02-04: 35 mg via INTRAVENOUS

## 2015-02-04 MED ORDER — PROPOFOL 10 MG/ML IV BOLUS
INTRAVENOUS | Status: DC | PRN
  Administered 2015-02-04: 150 mg via INTRAVENOUS

## 2015-02-04 MED ORDER — ONDANSETRON 4 MG PO TBDP: 4.0000 mg | ORAL_TABLET | ORAL | Status: DC | PRN

## 2015-02-04 SURGICAL SUPPLY — 33 items
CANISTER SUCT 1200ML W/VALVE (MISCELLANEOUS) ×3 IMPLANT
DRAPE LAPAROTOMY TRNSV 106X77 (MISCELLANEOUS) ×3 IMPLANT
ELECT CAUTERY NEEDLE TIP 1.0 (MISCELLANEOUS) ×3
ELECTRODE CAUTERY NEDL TIP 1.0 (MISCELLANEOUS) ×1 IMPLANT
GAUZE SPONGE 4X4 12PLY STRL (GAUZE/BANDAGES/DRESSINGS) IMPLANT
GLOVE BIO SURGEON STRL SZ7.5 (GLOVE) ×6 IMPLANT
GLOVE INDICATOR 8.0 STRL GRN (GLOVE) ×3 IMPLANT
GOWN STRL REUS W/ TWL LRG LVL3 (GOWN DISPOSABLE) ×2 IMPLANT
GOWN STRL REUS W/TWL LRG LVL3 (GOWN DISPOSABLE) ×4
JACKSON PRATT 10 (INSTRUMENTS) ×3 IMPLANT
JACKSON PRATT 7MM (INSTRUMENTS) ×6 IMPLANT
KIT RM TURNOVER STRD PROC AR (KITS) ×3 IMPLANT
LABEL OR SOLS (LABEL) ×3 IMPLANT
LIQUID BAND (GAUZE/BANDAGES/DRESSINGS) ×3 IMPLANT
MARGIN MAP 10MM (MISCELLANEOUS) ×3 IMPLANT
PACK BASIN MINOR ARMC (MISCELLANEOUS) ×3 IMPLANT
PAD ABD DERMACEA PRESS 5X9 (GAUZE/BANDAGES/DRESSINGS) IMPLANT
PAD GROUND ADULT SPLIT (MISCELLANEOUS) ×3 IMPLANT
SPONGE LAP 18X18 5 PK (GAUZE/BANDAGES/DRESSINGS) ×3 IMPLANT
SUT ETHILON 3-0 FS-10 30 BLK (SUTURE) ×6
SUT ETHILON 4-0 (SUTURE) ×6
SUT ETHILON 4-0 FS2 18XMFL BLK (SUTURE) ×3
SUT MON AB 2-0 CT1 36 (SUTURE) ×6 IMPLANT
SUT SILK 3 0 (SUTURE) ×2
SUT SILK 3-0 (SUTURE) ×4
SUT SILK 3-0 18XBRD TIE 12 (SUTURE) ×1 IMPLANT
SUT SILK 3-0 SH-1 18XCR BRD (SUTURE) ×2
SUT SILK 4 0 (SUTURE) ×2
SUT SILK 4-0 18XBRD TIE 12 (SUTURE) ×1 IMPLANT
SUTURE EHLN 3-0 FS-10 30 BLK (SUTURE) ×2 IMPLANT
SUTURE ETHLN 4-0 FS2 18XMF BLK (SUTURE) ×3 IMPLANT
SUTURE SILK 3-0 SH-1 18XCR BRD (SUTURE) ×2 IMPLANT
WATER STERILE IRR 1000ML POUR (IV SOLUTION) ×3 IMPLANT

## 2015-02-04 NOTE — Anesthesia Preprocedure Evaluation (Signed)
Anesthesia Evaluation  Patient identified by MRN, date of birth, ID band  Reviewed: Allergy & Precautions, NPO status , Patient's Chart, lab work & pertinent test results  History of Anesthesia Complications (+) Family history of anesthesia reaction and history of anesthetic complications (mother with PONV)  Airway Mallampati: II  TM Distance: >3 FB Neck ROM: Full    Dental  (+) Teeth Intact   Pulmonary former smoker (quit x 19 yrs),          Cardiovascular     Neuro/Psych    GI/Hepatic GERD-  Medicated and Poorly Controlled,  Endo/Other    Renal/GU      Musculoskeletal  (+) Arthritis -, Osteoarthritis,    Abdominal   Peds  Hematology   Anesthesia Other Findings   Reproductive/Obstetrics                             Anesthesia Physical Anesthesia Plan  ASA: II  Anesthesia Plan: General   Post-op Pain Management:    Induction: Intravenous and Rapid sequence  Airway Management Planned: Oral ETT  Additional Equipment:   Intra-op Plan:   Post-operative Plan:   Informed Consent: I have reviewed the patients History and Physical, chart, labs and discussed the procedure including the risks, benefits and alternatives for the proposed anesthesia with the patient or authorized representative who has indicated his/her understanding and acceptance.     Plan Discussed with:   Anesthesia Plan Comments:         Anesthesia Quick Evaluation

## 2015-02-04 NOTE — Anesthesia Postprocedure Evaluation (Signed)
  Anesthesia Post-op Note  Patient: Kimberly Thornton  Procedure(s) Performed: Procedure(s): SIMPLE MASTECTOMY (Right)  Anesthesia type:General  Patient location: PACU  Post pain: Pain level controlled  Post assessment: Post-op Vital signs reviewed, Patient's Cardiovascular Status Stable, Respiratory Function Stable, Patent Airway and No signs of Nausea or vomiting  Post vital signs: Reviewed and stable  Last Vitals:  Filed Vitals:   02/04/15 1520  BP: 123/59  Pulse: 49  Temp: 36.8 C  Resp:     Level of consciousness: awake, alert  and patient cooperative  Complications: No apparent anesthesia complications

## 2015-02-04 NOTE — Progress Notes (Signed)
Spoke with Dr. Rexene Edison at patient's request regarding advancing diet.  Orders received.

## 2015-02-04 NOTE — Progress Notes (Signed)

## 2015-02-04 NOTE — Anesthesia Procedure Notes (Signed)
Procedure Name: Intubation Date/Time: 02/04/2015 7:32 AM Performed by: Rosaria Ferries, Annalis Kaczmarczyk Pre-anesthesia Checklist: Patient identified, Emergency Drugs available, Suction available and Patient being monitored Patient Re-evaluated:Patient Re-evaluated prior to inductionOxygen Delivery Method: Circle system utilized Preoxygenation: Pre-oxygenation with 100% oxygen Intubation Type: IV induction Laryngoscope Size: Mac and 3 Grade View: Grade I Tube type: Oral Tube size: 7.0 mm Number of attempts: 1 Placement Confirmation: ETT inserted through vocal cords under direct vision,  positive ETCO2 and breath sounds checked- equal and bilateral Secured at: 21 cm Tube secured with: Tape Dental Injury: Teeth and Oropharynx as per pre-operative assessment

## 2015-02-04 NOTE — Transfer of Care (Signed)
Immediate Anesthesia Transfer of Care Note  Patient: Kimberly Thornton  Procedure(s) Performed: Procedure(s): SIMPLE MASTECTOMY (Right)  Patient Location: PACU  Anesthesia Type:General  Level of Consciousness: awake, alert , oriented and patient cooperative  Airway & Oxygen Therapy: Patient Spontanous Breathing and Patient connected to nasal cannula oxygen  Post-op Assessment: Report given to RN and Post -op Vital signs reviewed and stable  Post vital signs: Reviewed and stable  Last Vitals:  Filed Vitals:   02/04/15 0848  BP: 148/73  Pulse: 52  Temp:   Resp: 21    Complications: No apparent anesthesia complications

## 2015-02-04 NOTE — Op Note (Signed)
02/04/2015  8:42 AM  PATIENT:  Kimberly Thornton  64 y.o. female  PRE-OPERATIVE DIAGNOSIS:  BREAST CANCER  POST-OPERATIVE DIAGNOSIS:  Breast cancer  PROCEDURE:  Procedure(s): SIMPLE MASTECTOMY (Right)  SURGEON:  Surgeon(s) and Role:    * Dia Crawford III, MD - Primary   ASSISTANTS: none   ANESTHESIA:   general  EBL:  Total I/O In: 1000 [I.V.:1000] Out: 10 [Blood:10]   DRAINS: (2) Jackson-Pratt drain(s) with closed bulb suction in the Superior and inferior flaps   LOCAL MEDICATIONS USED:  NONE   DISPOSITION OF SPECIMEN:  PATHOLOGY   DICTATION: .Dragon Dictation with the patient supine position and after induction of appropriate general anesthesia the patient's right breast was prepped with ChloraPrep and draped sterile towels. An elliptical incision was made around the nipple and carried down through the subcutaneous taste tissue using the Bovie electrocautery. The superior flap was dissected down to the third intercostal space and the inferior flap was dissected down to the inframammary fold. The breast tissue was then swept off the pectoralis muscle using combination of blunt and Bovie dissection. The area was copious irrigated with warm saline solution.  27 mm flat Jackson-Pratt drains were inserted through the inferior flap 1 to the axilla and one to the superior flap. The drain was secured with 3-0 nylon sutures. The wound was then closed using 2-0 Monocryl suture and then covered with liquid Dermabond. Sterile dressings were placed over the drain sites. Patient was then returned recovery room having tolerated the procedure well. Sponge instrument needle count were correct 2 in the operating room.  PLAN OF CARE: Admit for overnight observation  PATIENT DISPOSITION:  PACU - hemodynamically stable.   Dia Crawford III, MD

## 2015-02-05 ENCOUNTER — Encounter: Payer: Self-pay | Admitting: Surgery

## 2015-02-05 DIAGNOSIS — C50911 Malignant neoplasm of unspecified site of right female breast: Secondary | ICD-10-CM | POA: Diagnosis not present

## 2015-02-05 DIAGNOSIS — E46 Unspecified protein-calorie malnutrition: Secondary | ICD-10-CM | POA: Insufficient documentation

## 2015-02-05 DIAGNOSIS — E440 Moderate protein-calorie malnutrition: Secondary | ICD-10-CM | POA: Insufficient documentation

## 2015-02-05 NOTE — Discharge Summary (Signed)
Patient ID: Kimberly Thornton MRN: [id number] DOB/AGE: [birth year] 64 y.o.  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 After appropriate preoperative preparation informed consent she underwent a right mastectomy. The procedure was uncomplicated. Drains were placed. She had no significant intraoperative problems. Postoperatively she is up active tolerating a diet with no complaints. She's had moderate drainage from her Jackson-Pratt drains. We will discharge her today in follow-up in the office in 7-10 days time for wound evaluation possible drain removal. She is in agreement.  Discharge Orders: Discharge Instructions    Diet - low sodium heart healthy    Complete by:  As directed      Discharge instructions    Complete by:  As directed   No heavy lifting, do not bathe while drains are in place.     Discharge wound care:    Complete by:  As directed   Change dressings on drain as needed     Increase activity slowly    Complete by:  As directed            Disposition: 01-Home or Self Care  Discharge Medications:  Current facility-administered medications:  .  acetaminophen (TYLENOL) tablet 650 mg, 650 mg, Oral, Q6H PRN, 650 mg at 02/04/15 1949 **OR** acetaminophen (TYLENOL) suppository 650 mg, 650 mg, Rectal, Q6H PRN, Dia Crawford III, MD .  ALPRAZolam Duanne Moron) tablet 0.25 mg, 0.25 mg, Oral, BID, Dia Crawford III, MD, 0.25 mg at 02/05/15 0947 .  brimonidine-timolol (COMBIGAN) 0.2-0.5 % ophthalmic solution 1 drop, 1 drop, Left Eye, BID, Dia Crawford III, MD, 1 drop at 02/04/15 2217 .  citalopram (CELEXA) tablet 20 mg, 20 mg, Oral, Daily, Dia Crawford III, MD, 20 mg at 02/04/15 2159 .  enoxaparin (LOVENOX) injection 40 mg, 40 mg, Subcutaneous,  Q24H, Dia Crawford III, MD, 40 mg at 02/05/15 0800 .  famotidine (PEPCID) tablet 20 mg, 20 mg, Oral, Daily, Dia Crawford III, MD, 20 mg at 02/05/15 0947 .  lactated ringers infusion, , Intravenous, Continuous, Alvin Critchley, MD, Last Rate: 75 mL/hr at 02/05/15 0759 .  LORazepam (ATIVAN) tablet 0.5 mg, 0.5 mg, Oral, QHS PRN, Dia Crawford III, MD, 0.5 mg at 02/04/15 2159 .  morphine 2 MG/ML injection 2 mg, 2 mg, Intravenous, Q2H PRN, Dia Crawford III, MD, 2 mg at 02/05/15 0102 .  ondansetron (ZOFRAN-ODT) disintegrating tablet 4 mg, 4 mg, Oral, Q4H PRN, Dia Crawford III, MD .  traMADol Veatrice Bourbon) tablet 50 mg, 50 mg, Oral, Q4H PRN, Dia Crawford III, MD, 50 mg at 02/05/15 0815  Follwup: Follow-up Information    Follow up with [facility] In 10 days.   Why:  For suture removal, For wound re-check  Drain removal   Contact information:   [address], [place] [address] [phone]      Signed: Dia Crawford III 02/05/2015, 3:44 PM

## 2015-02-05 NOTE — Progress Notes (Signed)
Initial Nutrition Assessment  DOCUMENTATION CODES:   Non-severe (moderate) malnutrition in context of chronic illness  INTERVENTION:  Meals and snacks: Cater to pt preferences Nutrition Supplement Therapy: Pt does not like supplements Nutrition diet education: Discussed high protein foods with pt. Pt verbalized understanding and expect good compliance   NUTRITION DIAGNOSIS:   Unintentional weight loss related to cancer and cancer related treatments as evidenced by per patient/family report, percent weight loss.    GOAL:   Patient will meet greater than or equal to 90% of their needs    MONITOR:    (Energy intake, Digestive system)  REASON FOR ASSESSMENT:   Malnutrition Screening Tool    ASSESSMENT:      Pt s/p mastectomy, partial gastrectomy in June 2016  Past Medical History  Diagnosis Date  . Breast cancer   . Arthritis   . GERD (gastroesophageal reflux disease)   . Stomach cancer   . Family history of adverse reaction to anesthesia     mother had nausea  . Glaucoma   . Anxiety     Current Nutrition: eating but does not like hospital foods  Food/Nutrition-Related History: Pt reports good intake prior to admission, eating 3 meals per day good amount at each meal   Medications: LR at 50ml/hr  Electrolyte/Renal Profile and Glucose Profile:   Recent Labs Lab 02/04/15 1138  CREATININE 0.84   Gastrointestinal Profile: Last BM:8/27   Nutrition-Focused Physical Exam Findings: Nutrition-Focused physical exam completed. Findings are normal to mild/moderate fat depletion, normal to mild-moderate muscle depletion, and no edema.   Pt reports still works   Weight Change: Wt loss of 16 pounds in the last 5 months per wt encounters (13% weight loss)  Pt reports has recently been gaining wt prior to admission.      Diet Order:  Diet regular Room service appropriate?: Yes; Fluid consistency:: Thin  Skin:   reviewed   Height:   Ht Readings from  Last 1 Encounters:  02/04/15 4\' 11"  (1.499 m)    Weight:   Wt Readings from Last 1 Encounters:  02/04/15 105 lb 12.8 oz (47.991 kg)        BMI:  Body mass index is 21.36 kg/(m^2).  Estimated Nutritional Needs:   Kcal:  BEE 945 kcals (IF 1.0-1.3, AF 1.3) 8315-1761 kcals/d  Protein:  (1.1-1.3 g/kg) 65-77 g/d  Fluid:  (30-79ml/kg) 1770-2021m/d  EDUCATION NEEDS:   No education needs identified at this time  [place]. Zenia Resides, [place], [place] (pager)

## 2015-02-05 NOTE — Discharge Instructions (Signed)
Surgical Site Infections FAQs  What is a Surgical Site Infection (SSI)?  A surgical site infection is an infection that occurs after surgery in the part of the body where the surgery took place. Most patients who have surgery do not develop an infection. However, infections develop in about 1 to 3 out of every 100 patients who have surgery.  Some of the common symptoms of a surgical site infection are:   Redness and pain around the area where you had surgery   Drainage of cloudy fluid from your surgical wound   Fever  Can SSIs be treated?  Yes. Most surgical site infections can be treated with antibiotics. The antibiotic given to you depends on the bacteria (germs) causing the infections. Sometimes patients with SSIs also need another surgery to treat the infection.  What are some of the things that hospitals are doing to prevent SSIs?  To prevent SSIs, doctors, nurses, and other healthcare providers:   Clean their hands and arms up to their elbows with an antiseptic agent just before the surgery.   Clean their hands with soap and water or an alcohol-based hand rub before and after caring for each patient.   May remove some of your hair immediately before your surgery using electric clippers if the hair is in the same area where the procedure will occur. They should not shave you with a razor.   Wear special hair covers, masks, gowns, and gloves during surgery to keep the surgery area clean.   Give you antibiotics before your surgery starts. In most cases, you should get antibiotics within 60 minutes before the surgery starts and the antibiotics should be stopped within 24 hours after surgery.   Clean the skin at the site of your surgery with a special soap that kills germs.  What can I do to help prevent SSIs?  Before your surgery:   Tell your doctor about other medical problems you may have. Health problems such as allergies, diabetes, and obesity could affect your surgery and your treatment.   Quit  smoking. Patients who smoke get more infections. Talk to your doctor about how you can quit before your surgery.   Do not shave near where you will have surgery. Shaving with a razor can irritate your skin and make it easier to develop an infection.  At the time of your surgery:   Speak up if someone tries to shave you with a razor before surgery. Ask why you need to be shaved and talk with your surgeon if you have any concerns.   Ask if you will get antibiotics before surgery.  After your surgery:   Make sure that your healthcare providers clean their hands before examining you, either with soap and water or an alcohol-based hand rub.   If you do not see your providers clean their hands, please ask them to do so.   Family and friends who visit you should not touch the surgical wound or dressings.   Family and friends should clean their hands with soap and water or an alcohol-based hand rub before and after visiting you. If you do not see them clean their hands, ask them to clean their hands.  What do I need to do when I go home from the hospital?   Before you go home, your doctor or nurse should explain everything you need to know about taking care of your wound. Make sure you understand how to care for your wound before you leave the hospital.     Always clean your hands before and after caring for your wound.   Before you go home, make sure you know who to contact if you have questions or problems after you get home.   If you have any symptoms of an infection, such as redness and pain at the surgery site, drainage, or fever, call your doctor immediately.  If you have additional questions, please ask your doctor or nurse.  Developed and co-sponsored by The Society for Healthcare Epidemiology of America (SHEA); Infectious Diseases Society of America (IDSA); American Hospital Association; Association for Professionals in Infection Control and Epidemiology (APIC); Centers for Disease Control and Prevention  (CDC); and The Joint Commission.  Document Released: 05/29/2013 Document Reviewed: 05/29/2013  [employer] Patient Information 2015 [employer]. This information is not intended to replace advice given to you by your health care provider. Make sure you discuss any questions you have with your health care provider.

## 2015-02-06 LAB — SURGICAL PATHOLOGY

## 2015-02-07 ENCOUNTER — Telehealth: Payer: Self-pay

## 2015-02-07 NOTE — Telephone Encounter (Signed)
Patient called at this time and wanted to know when follow-up appointment is. She was on schedule with Dr. Rexene Edison on 9/9, but I did cancel this appointment because I need to speak with Dr. Pat Patrick regarding pathology results, prior to scheduling patient for post-op. Contacted Dr. Pat Patrick, waiting on response and then I will call patient back with post-op date and time.

## 2015-02-11 ENCOUNTER — Inpatient Hospital Stay: Payer: BLUE CROSS/BLUE SHIELD

## 2015-02-11 ENCOUNTER — Telehealth: Payer: Self-pay | Admitting: Surgery

## 2015-02-11 ENCOUNTER — Encounter: Payer: Self-pay | Admitting: Oncology

## 2015-02-11 ENCOUNTER — Inpatient Hospital Stay (HOSPITAL_BASED_OUTPATIENT_CLINIC_OR_DEPARTMENT_OTHER): Payer: BLUE CROSS/BLUE SHIELD | Admitting: Oncology

## 2015-02-11 ENCOUNTER — Inpatient Hospital Stay: Payer: BLUE CROSS/BLUE SHIELD | Attending: Oncology

## 2015-02-11 VITALS — BP 129/86 | HR 58 | Temp 95.9°F | Wt 103.2 lb

## 2015-02-11 DIAGNOSIS — Z171 Estrogen receptor negative status [ER-]: Secondary | ICD-10-CM

## 2015-02-11 DIAGNOSIS — K219 Gastro-esophageal reflux disease without esophagitis: Secondary | ICD-10-CM

## 2015-02-11 DIAGNOSIS — Z87891 Personal history of nicotine dependence: Secondary | ICD-10-CM

## 2015-02-11 DIAGNOSIS — Z800 Family history of malignant neoplasm of digestive organs: Secondary | ICD-10-CM | POA: Insufficient documentation

## 2015-02-11 DIAGNOSIS — C50411 Malignant neoplasm of upper-outer quadrant of right female breast: Secondary | ICD-10-CM

## 2015-02-11 DIAGNOSIS — C162 Malignant neoplasm of body of stomach: Secondary | ICD-10-CM

## 2015-02-11 DIAGNOSIS — Z79899 Other long term (current) drug therapy: Secondary | ICD-10-CM | POA: Insufficient documentation

## 2015-02-11 DIAGNOSIS — Z803 Family history of malignant neoplasm of breast: Secondary | ICD-10-CM | POA: Diagnosis not present

## 2015-02-11 DIAGNOSIS — C778 Secondary and unspecified malignant neoplasm of lymph nodes of multiple regions: Secondary | ICD-10-CM | POA: Diagnosis not present

## 2015-02-11 DIAGNOSIS — C169 Malignant neoplasm of stomach, unspecified: Secondary | ICD-10-CM | POA: Insufficient documentation

## 2015-02-11 DIAGNOSIS — Z9011 Acquired absence of right breast and nipple: Secondary | ICD-10-CM | POA: Insufficient documentation

## 2015-02-11 DIAGNOSIS — M1990 Unspecified osteoarthritis, unspecified site: Secondary | ICD-10-CM | POA: Insufficient documentation

## 2015-02-11 DIAGNOSIS — C50911 Malignant neoplasm of unspecified site of right female breast: Secondary | ICD-10-CM

## 2015-02-11 DIAGNOSIS — C787 Secondary malignant neoplasm of liver and intrahepatic bile duct: Secondary | ICD-10-CM | POA: Diagnosis not present

## 2015-02-11 DIAGNOSIS — Z5111 Encounter for antineoplastic chemotherapy: Secondary | ICD-10-CM | POA: Insufficient documentation

## 2015-02-11 LAB — CBC WITH DIFFERENTIAL/PLATELET
BASOS ABS: 0 10*3/uL (ref 0–0.1)
Basophils Relative: 0 %
Eosinophils Absolute: 0.1 10*3/uL (ref 0–0.7)
Eosinophils Relative: 2 %
HEMATOCRIT: 32.1 % — AB (ref 35.0–47.0)
Hemoglobin: 10.4 g/dL — ABNORMAL LOW (ref 12.0–16.0)
LYMPHS ABS: 1 10*3/uL (ref 1.0–3.6)
LYMPHS PCT: 22 %
MCH: 27 pg (ref 26.0–34.0)
MCHC: 32.6 g/dL (ref 32.0–36.0)
MCV: 83 fL (ref 80.0–100.0)
MONO ABS: 0.2 10*3/uL (ref 0.2–0.9)
MONOS PCT: 5 %
NEUTROS ABS: 3.3 10*3/uL (ref 1.4–6.5)
Neutrophils Relative %: 71 %
Platelets: 173 10*3/uL (ref 150–440)
RBC: 3.86 MIL/uL (ref 3.80–5.20)
RDW: 16.2 % — AB (ref 11.5–14.5)
WBC: 4.7 10*3/uL (ref 3.6–11.0)

## 2015-02-11 LAB — COMPREHENSIVE METABOLIC PANEL
ALT: 8 U/L — ABNORMAL LOW (ref 14–54)
AST: 17 U/L (ref 15–41)
Albumin: 3.8 g/dL (ref 3.5–5.0)
Alkaline Phosphatase: 54 U/L (ref 38–126)
Anion gap: 7 (ref 5–15)
BILIRUBIN TOTAL: 0.3 mg/dL (ref 0.3–1.2)
BUN: 18 mg/dL (ref 6–20)
CO2: 28 mmol/L (ref 22–32)
CREATININE: 0.74 mg/dL (ref 0.44–1.00)
Calcium: 8.3 mg/dL — ABNORMAL LOW (ref 8.9–10.3)
Chloride: 99 mmol/L — ABNORMAL LOW (ref 101–111)
Glucose, Bld: 138 mg/dL — ABNORMAL HIGH (ref 65–99)
POTASSIUM: 3.8 mmol/L (ref 3.5–5.1)
Sodium: 134 mmol/L — ABNORMAL LOW (ref 135–145)
TOTAL PROTEIN: 6.9 g/dL (ref 6.5–8.1)

## 2015-02-11 MED ORDER — DIPHENHYDRAMINE HCL 25 MG PO CAPS
50.0000 mg | ORAL_CAPSULE | Freq: Once | ORAL | Status: DC
  Filled 2015-02-11: qty 2

## 2015-02-11 MED ORDER — TRASTUZUMAB CHEMO INJECTION 440 MG
6.0000 mg/kg | Freq: Once | INTRAVENOUS | Status: AC
  Administered 2015-02-11: 273 mg via INTRAVENOUS
  Filled 2015-02-11: qty 13

## 2015-02-11 MED ORDER — TRASTUZUMAB CHEMO INJECTION 440 MG: 6.0000 mg/kg | Freq: Once | INTRAVENOUS | Status: DC

## 2015-02-11 MED ORDER — SODIUM CHLORIDE 0.9 % IV SOLN
Freq: Once | INTRAVENOUS | Status: AC
  Administered 2015-02-11: 16:00:00 via INTRAVENOUS
  Filled 2015-02-11: qty 1000

## 2015-02-11 MED ORDER — LORAZEPAM 0.5 MG PO TABS: 0.5000 mg | ORAL_TABLET | Freq: Every evening | ORAL | Status: DC | PRN

## 2015-02-11 MED ORDER — HEPARIN SOD (PORK) LOCK FLUSH 100 UNIT/ML IV SOLN
500.0000 [IU] | Freq: Once | INTRAVENOUS | Status: AC
  Administered 2015-02-11: 500 [IU] via INTRAVENOUS
  Filled 2015-02-11: qty 5

## 2015-02-11 MED ORDER — ACETAMINOPHEN 325 MG PO TABS
650.0000 mg | ORAL_TABLET | Freq: Once | ORAL | Status: AC
  Administered 2015-02-11: 650 mg via ORAL
  Filled 2015-02-11: qty 2

## 2015-02-11 MED ORDER — SODIUM CHLORIDE 0.9 % IJ SOLN
10.0000 mL | INTRAMUSCULAR | Status: DC | PRN
  Filled 2015-02-11: qty 10

## 2015-02-11 MED ORDER — DIPHENHYDRAMINE HCL 25 MG PO CAPS
25.0000 mg | ORAL_CAPSULE | Freq: Once | ORAL | Status: AC
  Administered 2015-02-11: 25 mg via ORAL

## 2015-02-11 NOTE — Progress Notes (Signed)
Patient does not have living will.  Former smoker. 

## 2015-02-11 NOTE — Progress Notes (Signed)
[place] @ [facility] Telephone:([phone]) [phone]  [phone]     SHATAVIA SANTOR OB: [birth year]  MR#: [id number]  ELF#:[id number]  Patient Care Team: Sofie Hartigan, MD as PCP - General (Family Medicine) Dia Crawford III, MD (Surgery) Clent Jacks, RN as Registered Nurse  CHIEF COMPLAINT:  Chief Complaint  Patient presents with  . Follow-up    1.  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 August , 2016  Mastectomy right breast pT1 pNxMx  estrogen receptor  Negative.  Progesterone receptor negative.  HER-2/neu receptor positive tumor  Oncology Flowsheet 11/17/2014 12/03/2014 12/24/2014 01/14/2015 02/04/2015 02/05/2015 02/05/2015  ALPRAZolam (XANAX) PO   - - - 0.25 mg 0.25 mg 0.25 mg  cyanocobalamin ((VITAMIN B-12)) IM - - 1,000 mcg - - - -  dexamethasone (DECADRON) IJ - - - - - - -  diphenhydrAMINE (BENADRYL) PO - 50 mg 25 mg 50 mg - - -  enoxaparin (LOVENOX) [place]   - - - 40 mg 40 mg    LORazepam (ATIVAN) IV - - 0.5 mg - - - -  LORazepam (ATIVAN) PO - - - - 0.5 mg - -  ondansetron (ZOFRAN) IV - - [ 8 mg ] - - - -  ondansetron (ZOFRAN) PO 4 mg - - - - - -  promethazine (PHENERGAN) IV - - - - - - -  trastuzumab (HERCEPTIN) IV - 6 mg/kg 6 mg/kg 6 mg/kg - - -    INTERVAL HISTORY: 64 year old lady who went upper endoscopy done revealed the stomach tumor to be decrease in size biopsy was negative.  Appetite is improving patient continues to be very apprehensive.  Here to initiate maintenance Herceptin therapy.  No chills.  No fever.  No nausea or vomiting Nov 05, 2014 Patient is here for ongoing evaluation and continuation of Herceptin therapy.  Patient is scheduled for partial gastrectomy on Monday.  No chills.  No fever.  Feeling weak and tired.  Somewhat depressed because of her mother's that recently. June 28: 016 Patient underwent  partial gastrectomy.  He tolerated treatment very well.  No evidence of residual adenocarcinoma was found.  Intraoperative ultrasound of the liver revealed only 2 mm hypoechoic area.  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  Patient is here for ongoing evaluation and treatment consideration.  Pathology report is reviewed.  Patient gradually gaining weight.  No sore breath. july, 2016 Patient is here for ongoing evaluation and treatment consideration. No chills fever.  Continues to lose weight.  Appetite is improving.  Patient remains very anxious.  Here for continuation of Herceptin therapy.  Patient has an appointment to see surgical service in August of 2016 August 9 [facility], 2016  Patient is here for continuation of Herceptin therapy.  Since last evaluation patient is again 6 pound weight appetite is improving.  Patient is due to be evaluated by Dr. Bronson Ing for surgical intervention for possible mastectomy and exhibiting no dissection. Feeling better.  Less depressed.  No nausea no vomiting no diarrhea.  September 6 , 2016  patient had a simple mastectomy.  lymph node dissection was not done  Chest walL   W ound  Iis still healing   REVIEW OF SYSTEMS:   Gen. status: Patient is somewhat apprehensive but not in any acute distress HEENT: No headache.  No hearing loss.  No ear pain.  No nosebleed or congestion.  No sore t hroat.  No difficulty swallowing  Cardiovascular system: No chest pain.  No palpitation.  No paroxysmal nocturnal dyspnea.  Respiratory system: No cough.  No hemoptysis.  No shortness of breath at rest or exertion.  No chest pain. Abdomen: Abdominal wound is healing well.  No palpable masses. Lower extremities no swelling Skin: No rash Neurological system: No dizziness.  No tingling.  His was.  no tingling numbness.  no focal weakness or any focal signs.  PAST MEDICAL HISTORY: Past Medical History  Diagnosis Date  . Breast cancer   . Arthritis   . GERD (gastroesophageal  reflux disease)   . Stomach cancer   . Family history of adverse reaction to anesthesia     mother had nausea  . Glaucoma   . Anxiety     PAST SURGICAL HISTORY: Past Surgical History  Procedure Laterality Date  . Abdominal hysterectomy    . Portacath placement  05/01/2014    Dr. Pat Patrick  . Gastrectomy N/A 11/11/2014    Procedure: Scarlett Presto I, GASTRODUODENOSTOMY, IOUS OF LIVER ;  Surgeon: Molly Maduro, MD;  Location: [facility] ORS;  Service: General;  Laterality: N/A;  . Simple mastectomy with axillary sentinel node biopsy Right 02/04/2015    Procedure: SIMPLE MASTECTOMY;  Surgeon: Dia Crawford III, MD;  Location: [facility] ORS;  Service: General;  Laterality: Right;    FAMILY HISTORY  Significant History/PMH:   Arthritis:    Glaucoma:    Breast Cancer:    GERD - Esophageal Reflux:    Colonoscopy:    Hysterectomy:   Preventive Screening:  Has patient had any of the following test? Colonscopy  Mammography  Pap Smear (1)   Last Colonoscopy: 2010(1)   Last Mammography: 2015(1)   Last Pap Smear: 2012-hysterectomy in 1970's(1)   Smoking History: Smoking History Cigarettes per day and smoked for 25 years less than a half pack per day; quit 17 years ago(1)  PFSH: Comments: mother had a breast cancer when she was 72 years old.mother sister had breast cancer.  Mother also has stomach cancer  Social History: negative alcohol, negative tobacco  Additional Past Medical and Surgical History: gastroesophageal reflux disease  Arthritis  Hysterectomy.  Ovaries were not removed           ADVANCED DIRECTIVES: Patient does not have any advanced healthcare directive. Information has been given.   HEALTH MAINTENANCE: Social History  Substance Use Topics  . Smoking status: Former Smoker    Quit date: 07/09/1996  . Smokeless tobacco: Never Used  . Alcohol Use: 1.8 - 2.4 oz/week    3-4 Standard drinks or equivalent per week      Allergies  Allergen Reactions  .  Codeine Other (See Comments) and Nausea Only    GI distress    Current Outpatient Prescriptions  Medication Sig Dispense Refill  . acetaminophen (TYLENOL) 500 MG tablet Take 500 mg by mouth every 6 (six) hours as needed for moderate pain or headache.    . ALPRAZolam (XANAX) 0.25 MG tablet Take 1 tablet (0.25 mg total) by mouth 2 (two) times daily at 10 am and 4 pm. 60 tablet 1  . brimonidine-timolol (COMBIGAN) 0.2-0.5 % ophthalmic solution Place 1 drop into the left eye 2 (two) times daily.     . citalopram (CELEXA) 20 MG tablet Take 1 tablet (20 mg total) by mouth daily. 90 tablet 3  . LORazepam (ATIVAN) 0.5 MG tablet Take 1 tablet (0.5 mg total) by mouth at bedtime as needed for  anxiety. 30 tablet 0  . metoCLOPramide (REGLAN) 10 MG tablet Take 1 tablet (10 mg total) by mouth 2 (two) times daily. 60 tablet 3  . omeprazole (PRILOSEC) 40 MG capsule Take 40 mg by mouth daily.   2  . ondansetron (ZOFRAN-ODT) 4 MG disintegrating tablet Take 1 tablet (4 mg total) by mouth every 4 (four) hours as needed for nausea. 30 tablet 3  . traMADol (ULTRAM) 50 MG tablet Take 1 tablet (50 mg total) by mouth every 4 (four) hours as needed (Mild pain). 60 tablet 0   No current facility-administered medications for this visit.   Facility-Administered Medications Ordered in Other Visits  Medication Dose Route Frequency Provider Last Rate Last Dose  . heparin lock flush 100 unit/mL  500 Units Intravenous Once Forest Gleason, MD      . sodium chloride 0.9 % injection 10 mL  10 mL Intravenous PRN Forest Gleason, MD        OBJECTIVE:  Filed Vitals:   02/11/15 1418  BP: 129/86  Pulse: 58  Temp: 95.9 F (35.5 C)     Body mass index is 20.84 kg/(m^2).    ECOG FS:1 - Symptomatic but completely ambulatory  PHYSICAL EXAM: Gen. status: Patient is alert oriented not any acute distress slightly apprehensive.  Lymphatic system: No palpable supraclavicular cervical or axillary lymphadenopathy examination of breast: Right  breast no palpable mass.  Left breast we have masses. Abdomen: Soft.  Liver and spleen not palpable.  Bowel sounds are present.  Lower extremity no swelling.  Neurological system: Heart functions within normal limit cranial nerves are intact no localizing sign skin: No ecchymosis no rash all other system has been reviewed and examined and they are within normal limit Cardiac exam revealed the PMI to be normally situated and sized. The rhythm was regular and no extrasystoles were noted during several minutes of auscultation. The first and second heart sounds were normal and physiologic splitting of the second heart sound was noted. There were no murmurs, rubs, clicks, or gallops. Examination of the chest was unremarkable. There were no bony deformities, no asymmetry, and no other abnormalities. Examination of the skin revealed no evidence of significant rashes, suspicious appearing nevi or other concerning lesions.  Lower extremity no edema   LAB RESULTS:  Infusion on 02/11/2015  Component Date Value Ref Range Status  . WBC 02/11/2015 4.7  3.6 - 11.0 K/uL Final   A-LINE DRAW  . RBC 02/11/2015 3.86  3.80 - 5.20 MIL/uL Final  . Hemoglobin 02/11/2015 10.4* 12.0 - 16.0 g/dL Final  . HCT 02/11/2015 32.1* 35.0 - 47.0 % Final  . MCV 02/11/2015 83.0  80.0 - 100.0 fL Final  . MCH 02/11/2015 27.0  26.0 - 34.0 pg Final  . MCHC 02/11/2015 32.6  32.0 - 36.0 g/dL Final  . RDW 02/11/2015 16.2* 11.5 - 14.5 % Final  . Platelets 02/11/2015 173  150 - 440 K/uL Final  . Neutrophils Relative % 02/11/2015 71   Final  . Neutro Abs 02/11/2015 3.3  1.4 - 6.5 K/uL Final  . Lymphocytes Relative 02/11/2015 22   Final  . Lymphs Abs 02/11/2015 1.0  1.0 - 3.6 K/uL Final  . Monocytes Relative 02/11/2015 5   Final  . Monocytes Absolute 02/11/2015 0.2  0.2 - 0.9 K/uL Final  . Eosinophils Relative 02/11/2015 2   Final  . Eosinophils Absolute 02/11/2015 0.1  0 - 0.7 K/uL Final  . Basophils Relative 02/11/2015 0   Final  .  Basophils  Absolute 02/11/2015 0.0  0 - 0.1 K/uL Final  . Sodium 02/11/2015 134* 135 - 145 mmol/L Final  . Potassium 02/11/2015 3.8  3.5 - 5.1 mmol/L Final  . Chloride 02/11/2015 99* 101 - 111 mmol/L Final  . CO2 02/11/2015 28  22 - 32 mmol/L Final  . Glucose, Bld 02/11/2015 138* 65 - 99 mg/dL Final  . BUN 02/11/2015 18  6 - 20 mg/dL Final  . Creatinine, Ser 02/11/2015 0.74  0.44 - 1.00 mg/dL Final  . Calcium 02/11/2015 8.3* 8.9 - 10.3 mg/dL Final  . Total Protein 02/11/2015 6.9  6.5 - 8.1 g/dL Final  . Albumin 02/11/2015 3.8  3.5 - 5.0 g/dL Final  . AST 02/11/2015 17  15 - 41 U/L Final  . ALT 02/11/2015 8* 14 - 54 U/L Final  . Alkaline Phosphatase 02/11/2015 54  38 - 126 U/L Final  . Total Bilirubin 02/11/2015 0.3  0.3 - 1.2 mg/dL Final  . GFR calc non Af Amer 02/11/2015 >60  >60 mL/min Final  . GFR calc Af Amer 02/11/2015 >60  >60 mL/min Final   Comment: (NOTE) The eGFR has been calculated using the CKD EPI equation. This calculation has not been validated in all clinical situations. eGFR's persistently <60 mL/min signify possible Chronic Kidney Disease.   . Anion gap 02/11/2015 7  5 - 15 Final    Lab Results  Component Value Date   LABCA2 13.3 04/23/2014   No results found for: CA199 Lab Results  Component Value Date   CEA 2.0 11/07/2014   No results found for: PSA No results found for: CA125   STUDIES: No results found.  ASSESSMENT: 1.  Carcinoma of breast (right) from clinical examination as well as reviewing CT scan it appears that patient has excellent response.  Will continue maintenance Herceptin therapy Possibility of local therapy is going to be discussed 2.  Carcinoma of stomach.  Pathology report has been reviewed.  Significant response to the chemotherapy .Marland Kitchen  Last MUGA scan was in July of 2016 reported to have ejection fraction of 62% MEDICAL DECISION MAKING:  Pathology report of partial gastrectomy is been reviewed. Continue Herceptin PATIENT   would  be referred to surgical service for possibility of mastectomy December 17, 2014 muga  scan of the heart FINDINGS: The left ventricular ejection fraction is equal to 62.2%. Previously this measured 65%. There is normal left ventricular wall motion.  IMPRESSION: 1. Normal left ventricular ejection fraction which is not significantly changed from previous exam. Patient expressed understanding and was in agreement with this plan. She also understands that She can call clinic at any time with any questions, concerns, or complaints.  2.  Proceed with Herceptin maintenance therapy  pathology report from breast cancer mastectomy has been reviewed Invasive 0 .7 centimeter tumor Lymph node dissection was not done This has been discussed.  with patient      Breast cancer   Staging form: Breast, AJCC 7th Edition     Clinical: Stage IV (T2, N1, M1) - Signed by Forest Gleason, MD on 10/19/2014 Cancer of body of stomach   Staging form: Stomach, AJCC 7th Edition     Clinical: Stage IIA (T3, N0, M0) - Marni Griffon, MD   02/11/2015 3:02 PM

## 2015-02-12 ENCOUNTER — Telehealth: Payer: Self-pay | Admitting: Surgery

## 2015-02-12 NOTE — Telephone Encounter (Signed)
Spoke with Dr. Pat Patrick and he would like to see patient 9/12 or 9/13. Pt placed on schedule for 9/12.

## 2015-02-12 NOTE — Telephone Encounter (Signed)
Patient was calling to schedule her follow up/ post op appointment with Dr Pat Patrick. Patient had Simple Mastectomy with Dr Pat Patrick on 8/30. Dr Pat Patrick states he wants to see patient in office on 02/17/15 to remove sutures and discuss pathology results. I have relayed this information to patient and she understands. Appointment time is 3:00pm on 9/12.

## 2015-02-12 NOTE — Telephone Encounter (Signed)
error 

## 2015-02-13 ENCOUNTER — Telehealth: Payer: Self-pay | Admitting: Surgery

## 2015-02-13 NOTE — Telephone Encounter (Signed)
Pt has called with questions concerning her JP drain.  Please contact patient to answer any questions. Her next office visit is scheduled with Dr Pat Patrick on 02/17/15.

## 2015-02-13 NOTE — Telephone Encounter (Signed)
Returned patient call. Patient reported that one of her drains have stopped filling up the bulb and is concerned that the drain tube my be blocked. Patient informed that all fluid may have drained out and thus the reason for no drainage coming through the tube. Directed patient to seek medical attention if she develops a fever, feels fluid building up at her surgery site, or develops uncontrolled pain. Patient confirmed understanding of directions and information.

## 2015-02-14 ENCOUNTER — Ambulatory Visit: Payer: BLUE CROSS/BLUE SHIELD | Admitting: Surgery

## 2015-02-17 ENCOUNTER — Ambulatory Visit (INDEPENDENT_AMBULATORY_CARE_PROVIDER_SITE_OTHER): Payer: BLUE CROSS/BLUE SHIELD | Admitting: Surgery

## 2015-02-17 ENCOUNTER — Encounter: Payer: Self-pay | Admitting: Surgery

## 2015-02-17 VITALS — BP 141/85 | HR 56 | Temp 97.8°F | Ht 59.0 in | Wt 101.4 lb

## 2015-02-17 DIAGNOSIS — C50412 Malignant neoplasm of upper-outer quadrant of left female breast: Secondary | ICD-10-CM

## 2015-02-17 NOTE — Patient Instructions (Signed)
You will need to follow-up in our office in 6 weeks to 1 month.  We will complete your disability forms for an additional 2 more weeks from today. If you are not feeling like you can go back to work at that time, please give our office a call and we can extend this if needed. This will depend on how you progress from surgery.  It will be very important that you follow-up yearly to have your mammograms.

## 2015-02-17 NOTE — Progress Notes (Signed)
Outpatient Surgical Follow Up  02/17/2015  Kimberly Thornton is an 64 y.o. female.   Chief Complaint  Patient presents with  . Routine Post Op    Right Breast Mastectomy    HPI: She is 12 days status post a left mastectomy for known malignancy following chemotherapy. She has been doing very well. She has minimal drainage from her Jackson-Pratt drains. She has no other complaints other than some mild incisional tenderness.  Past Medical History  Diagnosis Date  . Breast cancer   . Arthritis   . GERD (gastroesophageal reflux disease)   . Stomach cancer   . Family history of adverse reaction to anesthesia     mother had nausea  . Glaucoma   . Anxiety     Past Surgical History  Procedure Laterality Date  . Abdominal hysterectomy    . Portacath placement  05/01/2014    Dr. Pat Patrick  . Gastrectomy N/A 11/11/2014    Procedure: Scarlett Presto I, GASTRODUODENOSTOMY, IOUS OF LIVER ;  Surgeon: Molly Maduro, MD;  Location: [facility] ORS;  Service: General;  Laterality: N/A;  . Simple mastectomy with axillary sentinel node biopsy Right 02/04/2015    Procedure: SIMPLE MASTECTOMY;  Surgeon: Dia Crawford III, MD;  Location: [facility] ORS;  Service: General;  Laterality: Right;    Family History  Problem Relation Age of Onset  . Breast cancer Mother   . Stomach cancer Mother 47  . Lung cancer Father   . Heart attack Father   . Hypertension Father     Social History:  reports that she quit smoking about 18 years ago. She has never used smokeless tobacco. She reports that she drinks about 1.8 - 2.4 oz of alcohol per week. She reports that she does not use illicit drugs.  Allergies:  Allergies  Allergen Reactions  . Codeine Other (See Comments) and Nausea Only    GI distress    Medications reviewed.    ROS    BP 141/85 mmHg  Pulse 56  Temp(Src) 97.8 F (36.6 C) (Oral)  Ht 4\' 11"  (1.499 m)  Wt 101 lb 6.4 oz (45.995 kg)  BMI 20.47 kg/m2  Physical Exam her wounds look good.  There is no sign of any infection. There is some minor erythema about the lateral drain site. Both drains were removed.     No results found for this or any previous visit (from the past 48 hour(s)). No results found.  Assessment/Plan:  1. Malignant neoplasm of upper outer quadrant of female breast, left She is doing reasonably well. She is continuing her chemotherapy. I don't see any wound problems as a result. We'll plan to see her back in our office in 6 weeks. I discussed my upcoming retirement with her and she is aware of the change in physicians.     Dia Crawford III  02/17/2015,negative

## 2015-02-21 ENCOUNTER — Telehealth: Payer: Self-pay | Admitting: Surgery

## 2015-02-21 NOTE — Telephone Encounter (Signed)
Patient is calling with questions regarding her FMLA paperwork. Please call patient back when you are able.

## 2015-02-21 NOTE — Telephone Encounter (Signed)
Returned phone call to patient at this time. Patient could not remember what date she was to return to work from when we talked the other day.  She is set to return to work on 9/26 if she does not call before for an extension, depending on how she feels at that time.

## 2015-03-04 ENCOUNTER — Inpatient Hospital Stay: Payer: BLUE CROSS/BLUE SHIELD

## 2015-03-04 ENCOUNTER — Encounter: Payer: Self-pay | Admitting: Oncology

## 2015-03-04 ENCOUNTER — Inpatient Hospital Stay (HOSPITAL_BASED_OUTPATIENT_CLINIC_OR_DEPARTMENT_OTHER): Payer: BLUE CROSS/BLUE SHIELD | Admitting: Oncology

## 2015-03-04 VITALS — BP 148/85 | HR 65 | Temp 95.9°F | Wt 99.5 lb

## 2015-03-04 DIAGNOSIS — C50411 Malignant neoplasm of upper-outer quadrant of right female breast: Secondary | ICD-10-CM

## 2015-03-04 DIAGNOSIS — Z803 Family history of malignant neoplasm of breast: Secondary | ICD-10-CM

## 2015-03-04 DIAGNOSIS — C778 Secondary and unspecified malignant neoplasm of lymph nodes of multiple regions: Secondary | ICD-10-CM | POA: Diagnosis not present

## 2015-03-04 DIAGNOSIS — K219 Gastro-esophageal reflux disease without esophagitis: Secondary | ICD-10-CM

## 2015-03-04 DIAGNOSIS — C787 Secondary malignant neoplasm of liver and intrahepatic bile duct: Secondary | ICD-10-CM | POA: Diagnosis not present

## 2015-03-04 DIAGNOSIS — C162 Malignant neoplasm of body of stomach: Secondary | ICD-10-CM

## 2015-03-04 DIAGNOSIS — Z9011 Acquired absence of right breast and nipple: Secondary | ICD-10-CM

## 2015-03-04 DIAGNOSIS — C169 Malignant neoplasm of stomach, unspecified: Secondary | ICD-10-CM

## 2015-03-04 DIAGNOSIS — C50911 Malignant neoplasm of unspecified site of right female breast: Secondary | ICD-10-CM

## 2015-03-04 DIAGNOSIS — Z79899 Other long term (current) drug therapy: Secondary | ICD-10-CM

## 2015-03-04 DIAGNOSIS — Z87891 Personal history of nicotine dependence: Secondary | ICD-10-CM

## 2015-03-04 DIAGNOSIS — M1990 Unspecified osteoarthritis, unspecified site: Secondary | ICD-10-CM

## 2015-03-04 DIAGNOSIS — Z800 Family history of malignant neoplasm of digestive organs: Secondary | ICD-10-CM

## 2015-03-04 DIAGNOSIS — Z171 Estrogen receptor negative status [ER-]: Secondary | ICD-10-CM

## 2015-03-04 LAB — COMPREHENSIVE METABOLIC PANEL
ALT: 7 U/L — ABNORMAL LOW (ref 14–54)
ANION GAP: 6 (ref 5–15)
AST: 16 U/L (ref 15–41)
Albumin: 3.7 g/dL (ref 3.5–5.0)
Alkaline Phosphatase: 52 U/L (ref 38–126)
BILIRUBIN TOTAL: 0.2 mg/dL — AB (ref 0.3–1.2)
BUN: 15 mg/dL (ref 6–20)
CO2: 28 mmol/L (ref 22–32)
Calcium: 8.2 mg/dL — ABNORMAL LOW (ref 8.9–10.3)
Chloride: 100 mmol/L — ABNORMAL LOW (ref 101–111)
Creatinine, Ser: 0.82 mg/dL (ref 0.44–1.00)
Glucose, Bld: 129 mg/dL — ABNORMAL HIGH (ref 65–99)
POTASSIUM: 3.5 mmol/L (ref 3.5–5.1)
Sodium: 134 mmol/L — ABNORMAL LOW (ref 135–145)
TOTAL PROTEIN: 6.6 g/dL (ref 6.5–8.1)

## 2015-03-04 LAB — CBC WITH DIFFERENTIAL/PLATELET
Basophils Absolute: 0 10*3/uL (ref 0–0.1)
Basophils Relative: 0 %
Eosinophils Absolute: 0 10*3/uL (ref 0–0.7)
Eosinophils Relative: 1 %
HEMATOCRIT: 32.1 % — AB (ref 35.0–47.0)
Hemoglobin: 10.5 g/dL — ABNORMAL LOW (ref 12.0–16.0)
LYMPHS PCT: 26 %
Lymphs Abs: 1 10*3/uL (ref 1.0–3.6)
MCH: 27 pg (ref 26.0–34.0)
MCHC: 32.6 g/dL (ref 32.0–36.0)
MCV: 82.7 fL (ref 80.0–100.0)
MONO ABS: 0.3 10*3/uL (ref 0.2–0.9)
MONOS PCT: 7 %
NEUTROS ABS: 2.6 10*3/uL (ref 1.4–6.5)
Neutrophils Relative %: 66 %
Platelets: 158 10*3/uL (ref 150–440)
RBC: 3.88 MIL/uL (ref 3.80–5.20)
RDW: 16.2 % — AB (ref 11.5–14.5)
WBC: 4 10*3/uL (ref 3.6–11.0)

## 2015-03-04 LAB — MAGNESIUM: MAGNESIUM: 1.8 mg/dL (ref 1.7–2.4)

## 2015-03-04 MED ORDER — HEPARIN SOD (PORK) LOCK FLUSH 100 UNIT/ML IV SOLN
250.0000 [IU] | Freq: Once | INTRAVENOUS | Status: AC | PRN
  Administered 2015-03-04: 250 [IU]
  Filled 2015-03-04: qty 5

## 2015-03-04 MED ORDER — TRASTUZUMAB CHEMO INJECTION 440 MG
273.0000 mg | Freq: Once | INTRAVENOUS | Status: AC
  Administered 2015-03-04: 273 mg via INTRAVENOUS
  Filled 2015-03-04: qty 13

## 2015-03-04 MED ORDER — DIPHENHYDRAMINE HCL 25 MG PO CAPS
50.0000 mg | ORAL_CAPSULE | Freq: Once | ORAL | Status: AC
  Administered 2015-03-04: 25 mg via ORAL
  Filled 2015-03-04: qty 1

## 2015-03-04 MED ORDER — LORAZEPAM 0.5 MG PO TABS: 0.5000 mg | ORAL_TABLET | Freq: Every evening | ORAL | Status: DC | PRN

## 2015-03-04 MED ORDER — ALPRAZOLAM 0.25 MG PO TABS: 0.2500 mg | ORAL_TABLET | Freq: Two times a day (BID) | ORAL | Status: DC

## 2015-03-04 MED ORDER — CYANOCOBALAMIN 1000 MCG/ML IJ SOLN
1000.0000 ug | Freq: Once | INTRAMUSCULAR | Status: AC
  Administered 2015-03-04: 1000 ug via INTRAMUSCULAR
  Filled 2015-03-04: qty 1

## 2015-03-04 MED ORDER — SODIUM CHLORIDE 0.9 % IV SOLN
Freq: Once | INTRAVENOUS | Status: AC
  Administered 2015-03-04: 15:00:00 via INTRAVENOUS
  Filled 2015-03-04: qty 1000

## 2015-03-04 MED ORDER — METOCLOPRAMIDE HCL 10 MG PO TABS: 10.0000 mg | ORAL_TABLET | Freq: Two times a day (BID) | ORAL | Status: DC

## 2015-03-04 MED ORDER — ACETAMINOPHEN 325 MG PO TABS
650.0000 mg | ORAL_TABLET | Freq: Once | ORAL | Status: AC
  Administered 2015-03-04: 650 mg via ORAL
  Filled 2015-03-04: qty 2

## 2015-03-04 NOTE — Progress Notes (Signed)
Patient does not have living will.  Former smoker.  Patient would like B-12  Injection.  Also requesting refills for Reglan, Xanax and Ativan.

## 2015-03-08 ENCOUNTER — Encounter: Payer: Self-pay | Admitting: Oncology

## 2015-03-08 NOTE — Progress Notes (Signed)
[place] @ [facility] [phone]  Fax:([phone]     CLAY MENSER OB: 07-26-1950  MR#: [id number]  KLK#:[id number]  Patient Care Team: Sofie Hartigan, MD as PCP - General (Family Medicine) Dia Crawford III, MD (Surgery) Clent Jacks, RN as Registered Nurse  CHIEF COMPLAINT:  Chief Complaint  Patient presents with  . OTHER    1.  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 August , 2016  Mastectomy right breast pT1 pNxMx  estrogen receptor  Negative.  Progesterone receptor negative.  HER-2/neu receptor positive tumor   Oncology Flowsheet 12/24/2014 01/14/2015 02/04/2015 02/05/2015 02/05/2015 02/11/2015 03/04/2015  ALPRAZolam (XANAX) PO - - 0.25 mg 0.25 mg 0.25 mg - -  cyanocobalamin ((VITAMIN B-12)) IM 1,000 mcg - - - - - 1,000 mcg  dexamethasone (DECADRON) IJ - - - - - - -  diphenhydrAMINE (BENADRYL) PO 25 mg 50 mg - - - 25 mg 25 mg  enoxaparin (LOVENOX) [place] - - 40 mg 40 mg   - -  LORazepam (ATIVAN) IV 0.5 mg - - - - - -  LORazepam (ATIVAN) PO - - 0.5 mg - - - -  ondansetron (ZOFRAN) IV [ 8 mg ] - - - - - -  ondansetron (ZOFRAN) PO - - - - - - -  promethazine (PHENERGAN) IV - - - - - - -  trastuzumab (HERCEPTIN) IV 6 mg/kg 6 mg/kg - - - 6 mg/kg 273 mg    INTERVAL HISTORY: 64 year old lady who went upper endoscopy done revealed the stomach tumor to be decrease in size biopsy was negative.  Appetite is improving patient continues to be very apprehensive.  Here to initiate maintenance Herceptin therapy.  No chills.  No fever.  No nausea or vomiting Patient is here for ongoing evaluation and continuation of chemotherapy with Herceptin.  Patient continues to lose weight.  Appetite remains stable.  No bony pain no nausea no vomiting or diarrhea   REVIEW OF SYSTEMS:   Gen. status: Patient is somewhat apprehensive but not in  any acute distress HEENT: No headache.  No hearing loss.  No ear pain.  No nosebleed or congestion.  No sore t hroat.  No difficulty swallowing  Cardiovascular system: No chest pain.  No palpitation.  No paroxysmal nocturnal dyspnea.  Respiratory system: No cough.  No hemoptysis.  No shortness of breath at rest or exertion.  No chest pain. Abdomen: Abdominal wound is healing well.  No palpable masses. Lower extremities no swelling Skin: No rash Neurological system: No dizziness.  No tingling.  His was.  no tingling numbness.  no focal weakness or any focal signs.  PAST MEDICAL HISTORY: Past Medical History  Diagnosis Date  . Breast cancer ([place])   . Arthritis   . GERD (gastroesophageal reflux disease)   . Stomach cancer ([place])   . Family history of adverse reaction to anesthesia     mother had nausea  . Glaucoma   . Anxiety     PAST SURGICAL HISTORY: Past Surgical History  Procedure Laterality Date  . Abdominal hysterectomy    . Portacath placement  05/01/2014    Dr. Pat Patrick  . Gastrectomy N/A 11/11/2014    Procedure: Scarlett Presto I, GASTRODUODENOSTOMY, IOUS OF LIVER ;  Surgeon: Molly Maduro, MD;  Location: [facility] ORS;  Service: General;  Laterality: N/A;  . Simple mastectomy with axillary sentinel node  biopsy Right 02/04/2015    Procedure: SIMPLE MASTECTOMY;  Surgeon: Dia Crawford III, MD;  Location: [facility] ORS;  Service: General;  Laterality: Right;    FAMILY HISTORY  Significant History/PMH:   Arthritis:    Glaucoma:    Breast Cancer:    GERD - Esophageal Reflux:    Colonoscopy:    Hysterectomy:   Preventive Screening:  Has patient had any of the following test? Colonscopy  Mammography  Pap Smear (1)   Last Colonoscopy: 2010(1)   Last Mammography: 2015(1)   Last Pap Smear: 2012-hysterectomy in 1970's(1)   Smoking History: Smoking History Cigarettes per day and smoked for 25 years less than a half pack per day; quit 17 years ago(1)  PFSH: Comments:  mother had a breast cancer when she was 70 years old.mother sister had breast cancer.  Mother also has stomach cancer  Social History: negative alcohol, negative tobacco  Additional Past Medical and Surgical History: gastroesophageal reflux disease  Arthritis  Hysterectomy.  Ovaries were not removed           ADVANCED DIRECTIVES: Patient does not have any advanced healthcare directive. Information has been given.   HEALTH MAINTENANCE: Social History  Substance Use Topics  . Smoking status: Former Smoker    Quit date: 07/09/1996  . Smokeless tobacco: Never Used  . Alcohol Use: 1.8 - 2.4 oz/week    3-4 Standard drinks or equivalent per week      Allergies  Allergen Reactions  . Codeine Other (See Comments) and Nausea Only    GI distress    Current Outpatient Prescriptions  Medication Sig Dispense Refill  . acetaminophen (TYLENOL) 500 MG tablet Take 500 mg by mouth every 6 (six) hours as needed for moderate pain or headache.    . ALPRAZolam (XANAX) 0.25 MG tablet Take 1 tablet (0.25 mg total) by mouth 2 (two) times daily at 10 am and 4 pm. 60 tablet 3  . brimonidine-timolol (COMBIGAN) 0.2-0.5 % ophthalmic solution Place 1 drop into the left eye 2 (two) times daily.     . citalopram (CELEXA) 20 MG tablet Take 1 tablet (20 mg total) by mouth daily. 90 tablet 3  . LORazepam (ATIVAN) 0.5 MG tablet Take 1 tablet (0.5 mg total) by mouth at bedtime as needed for anxiety. 30 tablet 3  . metoCLOPramide (REGLAN) 10 MG tablet Take 1 tablet (10 mg total) by mouth 2 (two) times daily. 60 tablet 3  . omeprazole (PRILOSEC) 40 MG capsule Take 40 mg by mouth daily.   2  . ondansetron (ZOFRAN-ODT) 4 MG disintegrating tablet Take 1 tablet (4 mg total) by mouth every 4 (four) hours as needed for nausea. 30 tablet 3  . traMADol (ULTRAM) 50 MG tablet Take 1 tablet (50 mg total) by mouth every 4 (four) hours as needed (Mild pain). 60 tablet 0   No current facility-administered medications for this  visit.    OBJECTIVE:  Filed Vitals:   03/04/15 1428  BP: 148/85  Pulse: 65  Temp: 95.9 F (35.5 C)     Body mass index is 20.09 kg/(m^2).    ECOG FS:1 - Symptomatic but completely ambulatory  PHYSICAL EXAM: Gen. status: Patient is alert oriented not any acute distress slightly apprehensive.  Lymphatic system: No palpable supraclavicular cervical or axillary lymphadenopathy examination of breast: Right breast no palpable mass.  Left breast we have masses. Abdomen: Soft.  Liver and spleen not palpable.  Bowel sounds are present.  Lower extremity no swelling.  Neurological system:  Heart functions within normal limit cranial nerves are intact no localizing sign skin: No ecchymosis no rash all other system has been reviewed and examined and they are within normal limit Cardiac exam revealed the PMI to be normally situated and sized. The rhythm was regular and no extrasystoles were noted during several minutes of auscultation. The first and second heart sounds were normal and physiologic splitting of the second heart sound was noted. There were no murmurs, rubs, clicks, or gallops. Examination of the chest was unremarkable. There were no bony deformities, no asymmetry, and no other abnormalities. Examination of the skin revealed no evidence of significant rashes, suspicious appearing nevi or other concerning lesions.  Lower extremity no edema   LAB RESULTS:  Infusion on 03/04/2015  Component Date Value Ref Range Status  . WBC 03/04/2015 4.0  3.6 - 11.0 K/uL Final   A-LINE DRAW  . RBC 03/04/2015 3.88  3.80 - 5.20 MIL/uL Final  . Hemoglobin 03/04/2015 10.5* 12.0 - 16.0 g/dL Final  . HCT 03/04/2015 32.1* 35.0 - 47.0 % Final  . MCV 03/04/2015 82.7  80.0 - 100.0 fL Final  . MCH 03/04/2015 27.0  26.0 - 34.0 pg Final  . MCHC 03/04/2015 32.6  32.0 - 36.0 g/dL Final  . RDW 03/04/2015 16.2* 11.5 - 14.5 % Final  . Platelets 03/04/2015 158  150 - 440 K/uL Final  . Neutrophils Relative % 03/04/2015 66    Final  . Neutro Abs 03/04/2015 2.6  1.4 - 6.5 K/uL Final  . Lymphocytes Relative 03/04/2015 26   Final  . Lymphs Abs 03/04/2015 1.0  1.0 - 3.6 K/uL Final  . Monocytes Relative 03/04/2015 7   Final  . Monocytes Absolute 03/04/2015 0.3  0.2 - 0.9 K/uL Final  . Eosinophils Relative 03/04/2015 1   Final  . Eosinophils Absolute 03/04/2015 0.0  0 - 0.7 K/uL Final  . Basophils Relative 03/04/2015 0   Final  . Basophils Absolute 03/04/2015 0.0  0 - 0.1 K/uL Final  . Sodium 03/04/2015 134* 135 - 145 mmol/L Final  . Potassium 03/04/2015 3.5  3.5 - 5.1 mmol/L Final  . Chloride 03/04/2015 100* 101 - 111 mmol/L Final  . CO2 03/04/2015 28  22 - 32 mmol/L Final  . Glucose, Bld 03/04/2015 129* 65 - 99 mg/dL Final  . BUN 03/04/2015 15  6 - 20 mg/dL Final  . Creatinine, Ser 03/04/2015 0.82  0.44 - 1.00 mg/dL Final  . Calcium 03/04/2015 8.2* 8.9 - 10.3 mg/dL Final  . Total Protein 03/04/2015 6.6  6.5 - 8.1 g/dL Final  . Albumin 03/04/2015 3.7  3.5 - 5.0 g/dL Final  . AST 03/04/2015 16  15 - 41 U/L Final  . ALT 03/04/2015 7* 14 - 54 U/L Final  . Alkaline Phosphatase 03/04/2015 52  38 - 126 U/L Final  . Total Bilirubin 03/04/2015 0.2* 0.3 - 1.2 mg/dL Final  . GFR calc non Af Amer 03/04/2015 >60  >60 mL/min Final  . GFR calc Af Amer 03/04/2015 >60  >60 mL/min Final   Comment: (NOTE) The eGFR has been calculated using the CKD EPI equation. This calculation has not been validated in all clinical situations. eGFR's persistently <60 mL/min signify possible Chronic Kidney Disease.   . Anion gap 03/04/2015 6  5 - 15 Final  . Magnesium 03/04/2015 1.8  1.7 - 2.4 mg/dL Final    Lab Results  Component Value Date   LABCA2 13.3 04/23/2014   No results found for: CA199 Lab Results  Component Value Date   CEA 2.0 11/07/2014   No results found for: PSA No results found for: CA125   STUDIES: No results found.  ASSESSMENT: 1.  Carcinoma of breast (right) from clinical examination as well as reviewing  CT scan it appears that patient has excellent response.  Will continue maintenance Herceptin therapy Possibility of local therapy is going to be discussed 2.  Carcinoma of stomach.  Pathology report has been reviewed.  Significant response to the chemotherapy .Marland Kitchen  Last MUGA scan was in July of 2016 reported to have ejection fraction of 62% MEDICAL DECISION MAKING:  Patient will continue Herceptin Lab data has been reviewed Patient continues to lose weight CONTINUES TO LOSE WEIGHT PET SCAN WILL BE DONE Reevaluate patient in 3 weeks Patient will have patient consultation because of partial gastrectomy her intake is decreased so appointment with dietitian has been made      Breast cancer   Staging form: Breast, AJCC 7th Edition     Clinical: Stage IV (T2, N1, M1) - Signed by Forest Gleason, MD on 10/19/2014 Cancer of body of stomach   Staging form: Stomach, AJCC 7th Edition     Clinical: Stage IIA (T3, N0, M0) - Marni Griffon, MD   03/08/2015 9:00 AM

## 2015-03-12 ENCOUNTER — Telehealth: Payer: Self-pay

## 2015-03-12 NOTE — Telephone Encounter (Signed)
Called [employer] after speaking with patient and changing disability forms. - [phone]  Spoke with Sherrill Raring at [phone] and was forwarded to patient's Case Manager [facility])- Ext. [phone]  No answer for [facility], so I left a message stating that we had changed the patient's disability and the updated forms were faxed over at this time with the chart documentation. Asked for her to call me back if she had any questions what so ever.

## 2015-03-12 NOTE — Telephone Encounter (Signed)
Patient made call to office and stated that she has been back to work since 03/03/15 but is absolutely exhausted and feels like she is not doing as well as she has been prior. Dr. Pat Patrick did discuss this with her at her last office visit and patient was persistant that she wanted to go back to work. She is now stating that she does not feel like she can recover sufficiently unless her hours are limited weekly. After negotiating with patient on what she thought she could do without becoming worse, we decided on 6 hours per day, 5 days per week. I explained to the patient that once I submit paperwork, she may NOT work over these recommendations. She verbalizes understanding of this.  Discussed with Dr. Azalee Course at this time.   Patient will follow-up in office on 04/02/15 with Dr. Azalee Course and we will then discuss once again if disability needs changed or if patient is feeling once again that she is able to return to full schedule.  Will re-fax forms to [employer] at this time and place a copy at front desk in [place] location to pick-up (per her preference) when she has time.

## 2015-03-25 ENCOUNTER — Encounter: Payer: Self-pay | Admitting: Oncology

## 2015-03-25 ENCOUNTER — Inpatient Hospital Stay: Payer: BLUE CROSS/BLUE SHIELD

## 2015-03-25 ENCOUNTER — Inpatient Hospital Stay: Payer: BLUE CROSS/BLUE SHIELD | Attending: Oncology | Admitting: Oncology

## 2015-03-25 VITALS — BP 107/75 | HR 54 | Temp 95.7°F | Wt 101.9 lb

## 2015-03-25 DIAGNOSIS — K219 Gastro-esophageal reflux disease without esophagitis: Secondary | ICD-10-CM | POA: Insufficient documentation

## 2015-03-25 DIAGNOSIS — Z90710 Acquired absence of both cervix and uterus: Secondary | ICD-10-CM | POA: Insufficient documentation

## 2015-03-25 DIAGNOSIS — C50411 Malignant neoplasm of upper-outer quadrant of right female breast: Secondary | ICD-10-CM

## 2015-03-25 DIAGNOSIS — C169 Malignant neoplasm of stomach, unspecified: Secondary | ICD-10-CM | POA: Diagnosis not present

## 2015-03-25 DIAGNOSIS — M1990 Unspecified osteoarthritis, unspecified site: Secondary | ICD-10-CM | POA: Diagnosis not present

## 2015-03-25 DIAGNOSIS — R531 Weakness: Secondary | ICD-10-CM | POA: Diagnosis not present

## 2015-03-25 DIAGNOSIS — Z79899 Other long term (current) drug therapy: Secondary | ICD-10-CM | POA: Diagnosis not present

## 2015-03-25 DIAGNOSIS — C778 Secondary and unspecified malignant neoplasm of lymph nodes of multiple regions: Secondary | ICD-10-CM | POA: Insufficient documentation

## 2015-03-25 DIAGNOSIS — Z87891 Personal history of nicotine dependence: Secondary | ICD-10-CM | POA: Diagnosis not present

## 2015-03-25 DIAGNOSIS — F419 Anxiety disorder, unspecified: Secondary | ICD-10-CM | POA: Diagnosis not present

## 2015-03-25 DIAGNOSIS — Z5111 Encounter for antineoplastic chemotherapy: Secondary | ICD-10-CM | POA: Insufficient documentation

## 2015-03-25 DIAGNOSIS — R5383 Other fatigue: Secondary | ICD-10-CM | POA: Insufficient documentation

## 2015-03-25 DIAGNOSIS — Z9011 Acquired absence of right breast and nipple: Secondary | ICD-10-CM | POA: Insufficient documentation

## 2015-03-25 DIAGNOSIS — C50911 Malignant neoplasm of unspecified site of right female breast: Secondary | ICD-10-CM

## 2015-03-25 DIAGNOSIS — Z171 Estrogen receptor negative status [ER-]: Secondary | ICD-10-CM | POA: Diagnosis not present

## 2015-03-25 DIAGNOSIS — C787 Secondary malignant neoplasm of liver and intrahepatic bile duct: Secondary | ICD-10-CM | POA: Insufficient documentation

## 2015-03-25 DIAGNOSIS — C779 Secondary and unspecified malignant neoplasm of lymph node, unspecified: Secondary | ICD-10-CM

## 2015-03-25 DIAGNOSIS — C162 Malignant neoplasm of body of stomach: Secondary | ICD-10-CM

## 2015-03-25 LAB — CBC WITH DIFFERENTIAL/PLATELET
BASOS ABS: 0 10*3/uL (ref 0–0.1)
BASOS PCT: 1 %
Eosinophils Absolute: 0 10*3/uL (ref 0–0.7)
Eosinophils Relative: 1 %
HEMATOCRIT: 31.4 % — AB (ref 35.0–47.0)
HEMOGLOBIN: 10.1 g/dL — AB (ref 12.0–16.0)
LYMPHS PCT: 27 %
Lymphs Abs: 1.1 10*3/uL (ref 1.0–3.6)
MCH: 26.5 pg (ref 26.0–34.0)
MCHC: 32.3 g/dL (ref 32.0–36.0)
MCV: 82 fL (ref 80.0–100.0)
Monocytes Absolute: 0.3 10*3/uL (ref 0.2–0.9)
Monocytes Relative: 8 %
NEUTROS ABS: 2.5 10*3/uL (ref 1.4–6.5)
NEUTROS PCT: 63 %
Platelets: 190 10*3/uL (ref 150–440)
RBC: 3.83 MIL/uL (ref 3.80–5.20)
RDW: 15.2 % — ABNORMAL HIGH (ref 11.5–14.5)
WBC: 4 10*3/uL (ref 3.6–11.0)

## 2015-03-25 LAB — COMPREHENSIVE METABOLIC PANEL
ALBUMIN: 3.5 g/dL (ref 3.5–5.0)
ALK PHOS: 53 U/L (ref 38–126)
ALT: 10 U/L — AB (ref 14–54)
AST: 16 U/L (ref 15–41)
Anion gap: 2 — ABNORMAL LOW (ref 5–15)
BILIRUBIN TOTAL: 0.3 mg/dL (ref 0.3–1.2)
BUN: 15 mg/dL (ref 6–20)
CALCIUM: 7.7 mg/dL — AB (ref 8.9–10.3)
CO2: 28 mmol/L (ref 22–32)
CREATININE: 0.79 mg/dL (ref 0.44–1.00)
Chloride: 102 mmol/L (ref 101–111)
GFR calc Af Amer: >60 mL/min (ref >60)
GFR calc non Af Amer: >60 mL/min (ref >60)
GLUCOSE: 114 mg/dL — AB (ref 65–99)
Potassium: 3.7 mmol/L (ref 3.5–5.1)
Sodium: 132 mmol/L — ABNORMAL LOW (ref 135–145)
TOTAL PROTEIN: 6.4 g/dL — AB (ref 6.5–8.1)

## 2015-03-25 LAB — TSH: TSH: 3.782 u[IU]/mL (ref 0.350–4.500)

## 2015-03-25 MED ORDER — SODIUM CHLORIDE 0.9 % IV SOLN
Freq: Once | INTRAVENOUS | Status: AC
  Administered 2015-03-25: 15:00:00 via INTRAVENOUS
  Filled 2015-03-25: qty 1000

## 2015-03-25 MED ORDER — HEPARIN SOD (PORK) LOCK FLUSH 100 UNIT/ML IV SOLN
500.0000 [IU] | Freq: Once | INTRAVENOUS | Status: AC | PRN
  Administered 2015-03-25: 500 [IU]
  Filled 2015-03-25: qty 5

## 2015-03-25 MED ORDER — SODIUM CHLORIDE 0.9 % IJ SOLN
10.0000 mL | INTRAMUSCULAR | Status: DC | PRN
  Administered 2015-03-25: 10 mL
  Filled 2015-03-25: qty 10

## 2015-03-25 MED ORDER — TRASTUZUMAB CHEMO INJECTION 440 MG
273.0000 mg | Freq: Once | INTRAVENOUS | Status: AC
  Administered 2015-03-25: 273 mg via INTRAVENOUS
  Filled 2015-03-25: qty 13

## 2015-03-25 MED ORDER — ACETAMINOPHEN 325 MG PO TABS
650.0000 mg | ORAL_TABLET | Freq: Once | ORAL | Status: AC
  Administered 2015-03-25: 650 mg via ORAL
  Filled 2015-03-25: qty 2

## 2015-03-25 MED ORDER — TRAMADOL HCL 50 MG PO TABS: 50.0000 mg | ORAL_TABLET | ORAL | Status: DC | PRN

## 2015-03-25 MED ORDER — DIPHENHYDRAMINE HCL 25 MG PO CAPS
50.0000 mg | ORAL_CAPSULE | Freq: Once | ORAL | Status: AC
  Administered 2015-03-25: 25 mg via ORAL
  Filled 2015-03-25: qty 2

## 2015-03-25 NOTE — Progress Notes (Signed)
Surgeon gave prescription for tramadol and patient wants to know if Dr. Oliva Thornton will write this for her?

## 2015-03-25 NOTE — Progress Notes (Signed)
[place] @ [facility] Telephone:(336) [phone]  Fax:(336) [phone]     Kimberly Thornton OB: 1950-06-14  MR#: [id number]  DUK#:[id number]  Patient Care Team: Sofie Hartigan, MD as PCP - General (Family Medicine) Dia Crawford III, MD (Surgery) Clent Jacks, RN as Registered Nurse  CHIEF COMPLAINT:  Chief Complaint  Patient presents with  . OTHER    1.  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 August , 2016  Mastectomy right breast pT1 pNxMx  estrogen receptor  Negative.  Progesterone receptor negative.  HER-2/neu receptor positive tumor   Oncology Flowsheet 12/24/2014 01/14/2015 02/04/2015 02/05/2015 02/05/2015 02/11/2015 03/04/2015  ALPRAZolam (XANAX) PO - - 0.25 mg 0.25 mg 0.25 mg - -  cyanocobalamin ((VITAMIN B-12)) IM 1,000 mcg - - - - - 1,000 mcg  dexamethasone (DECADRON) IJ - - - - - - -  diphenhydrAMINE (BENADRYL) PO 25 mg 50 mg - - - 25 mg 25 mg  enoxaparin (LOVENOX) [place] - - 40 mg 40 mg   - -  LORazepam (ATIVAN) IV 0.5 mg - - - - - -  LORazepam (ATIVAN) PO - - 0.5 mg - - - -  ondansetron (ZOFRAN) IV [ 8 mg ] - - - - - -  ondansetron (ZOFRAN) PO - - - - - - -  promethazine (PHENERGAN) IV - - - - - - -  trastuzumab (HERCEPTIN) IV 6 mg/kg 6 mg/kg - - - 6 mg/kg 273 mg    INTERVAL HISTORY: 64 year old lady who went upper endoscopy done revealed the stomach tumor to be decrease in size biopsy was negative.  Appetite is improving patient continues to be very apprehensive.  Here to initiate maintenance Herceptin therapy.  No chills.  No fever.  No nausea or vomiting Patient is here for ongoing evaluation and continuation of chemotherapy with Herceptin.  Patient continues to lose weight.  Appetite remains stable.  No bony pain no nausea no vomiting or diarrhea   March 25, 2015 Patient has gained weight.  Continues to feel weak and tired.   Appetite has been stable.  No nausea.  No vomiting.  No diarrhea.  Patient does not want a knee flu vaccine has never taken flu vaccine.  REVIEW OF SYSTEMS:   Gen. status: Patient is somewhat apprehensive but not in any acute distress HEENT: No headache.  No hearing loss.  No ear pain.  No nosebleed or congestion.  No sore t hroat.  No difficulty swallowing  Cardiovascular system: No chest pain.  No palpitation.  No paroxysmal nocturnal dyspnea.  Respiratory system: No cough.  No hemoptysis.  No shortness of breath at rest or exertion.  No chest pain. Abdomen: Abdominal wound is healing well.  No palpable masses. Lower extremities no swelling Skin: No rash Neurological system: No dizziness.  No tingling.  His was.  no tingling numbness.  no focal weakness or any focal signs.  PAST MEDICAL HISTORY: Past Medical History  Diagnosis Date  . Breast cancer ([place])   . Arthritis   . GERD (gastroesophageal reflux disease)   . Stomach cancer ([place])   . Family history of adverse reaction to anesthesia     mother had nausea  . Glaucoma   . Anxiety     PAST SURGICAL HISTORY: Past Surgical History  Procedure Laterality Date  . Abdominal hysterectomy    . Portacath placement  05/01/2014  Dr. Pat Patrick  . Gastrectomy N/A 11/11/2014    Procedure: Scarlett Presto I, GASTRODUODENOSTOMY, IOUS OF LIVER ;  Surgeon: Molly Maduro, MD;  Location: [facility] ORS;  Service: General;  Laterality: N/A;  . Simple mastectomy with axillary sentinel node biopsy Right 02/04/2015    Procedure: SIMPLE MASTECTOMY;  Surgeon: Dia Crawford III, MD;  Location: [facility] ORS;  Service: General;  Laterality: Right;    FAMILY HISTORY  Significant History/PMH:   Arthritis:    Glaucoma:    Breast Cancer:    GERD - Esophageal Reflux:    Colonoscopy:    Hysterectomy:   Preventive Screening:  Has patient had any of the following test? Colonscopy  Mammography  Pap Smear (1)   Last Colonoscopy: 2010(1)   Last  Mammography: 2015(1)   Last Pap Smear: 2012-hysterectomy in 1970's(1)   Smoking History: Smoking History Cigarettes per day and smoked for 25 years less than a half pack per day; quit 17 years ago(1)  PFSH: Comments: mother had a breast cancer when she was 51 years old.mother sister had breast cancer.  Mother also has stomach cancer  Social History: negative alcohol, negative tobacco  Additional Past Medical and Surgical History: gastroesophageal reflux disease  Arthritis  Hysterectomy.  Ovaries were not removed           ADVANCED DIRECTIVES: Patient does not have any advanced healthcare directive. Information has been given.   HEALTH MAINTENANCE: Social History  Substance Use Topics  . Smoking status: Former Smoker    Quit date: 07/09/1996  . Smokeless tobacco: Never Used  . Alcohol Use: 1.8 - 2.4 oz/week    3-4 Standard drinks or equivalent per week      Allergies  Allergen Reactions  . Codeine Other (See Comments) and Nausea Only    GI distress    Current Outpatient Prescriptions  Medication Sig Dispense Refill  . acetaminophen (TYLENOL) 500 MG tablet Take 500 mg by mouth every 6 (six) hours as needed for moderate pain or headache.    . ALPRAZolam (XANAX) 0.25 MG tablet Take 1 tablet (0.25 mg total) by mouth 2 (two) times daily at 10 am and 4 pm. 60 tablet 3  . brimonidine-timolol (COMBIGAN) 0.2-0.5 % ophthalmic solution Place 1 drop into the left eye 2 (two) times daily.     . citalopram (CELEXA) 20 MG tablet Take 1 tablet (20 mg total) by mouth daily. 90 tablet 3  . LORazepam (ATIVAN) 0.5 MG tablet Take 1 tablet (0.5 mg total) by mouth at bedtime as needed for anxiety. 30 tablet 3  . metoCLOPramide (REGLAN) 10 MG tablet Take 1 tablet (10 mg total) by mouth 2 (two) times daily. 60 tablet 3  . omeprazole (PRILOSEC) 40 MG capsule Take 40 mg by mouth daily.   2  . ondansetron (ZOFRAN-ODT) 4 MG disintegrating tablet Take 1 tablet (4 mg total) by mouth every 4 (four)  hours as needed for nausea. 30 tablet 3  . traMADol (ULTRAM) 50 MG tablet Take 1 tablet (50 mg total) by mouth every 4 (four) hours as needed (Mild pain). 60 tablet 0   No current facility-administered medications for this visit.    OBJECTIVE:  Filed Vitals:   03/25/15 1429  BP: 107/75  Pulse: 54  Temp: 95.7 F (35.4 C)     Body mass index is 20.56 kg/(m^2).    ECOG FS:1 - Symptomatic but completely ambulatory  PHYSICAL EXAM: Gen. status: Patient is alert oriented not any acute distress slightly apprehensive.  Lymphatic system:  No palpable supraclavicular cervical or axillary lymphadenopathy examination of breast: Right breast no palpable mass.  Left breast we have masses. Abdomen: Soft.  Liver and spleen not palpable.  Bowel sounds are present.  Lower extremity no swelling.  Neurological system: Heart functions within normal limit cranial nerves are intact no localizing sign skin: No ecchymosis no rash all other system has been reviewed and examined and they are within normal limit Cardiac exam revealed the PMI to be normally situated and sized. The rhythm was regular and no extrasystoles were noted during several minutes of auscultation. The first and second heart sounds were normal and physiologic splitting of the second heart sound was noted. There were no murmurs, rubs, clicks, or gallops. Examination of the chest was unremarkable. There were no bony deformities, no asymmetry, and no other abnormalities. Examination of the skin revealed no evidence of significant rashes, suspicious appearing nevi or other concerning lesions.  Lower extremity no edema   LAB RESULTS:  Infusion on 03/25/2015  Component Date Value Ref Range Status  . WBC 03/25/2015 4.0  3.6 - 11.0 K/uL Final   A-LINE DRAW  . RBC 03/25/2015 3.83  3.80 - 5.20 MIL/uL Final  . Hemoglobin 03/25/2015 10.1* 12.0 - 16.0 g/dL Final  . HCT 03/25/2015 31.4* 35.0 - 47.0 % Final  . MCV 03/25/2015 82.0  80.0 - 100.0 fL Final  .  MCH 03/25/2015 26.5  26.0 - 34.0 pg Final  . MCHC 03/25/2015 32.3  32.0 - 36.0 g/dL Final  . RDW 03/25/2015 15.2* 11.5 - 14.5 % Final  . Platelets 03/25/2015 190  150 - 440 K/uL Final  . Neutrophils Relative % 03/25/2015 63   Final  . Neutro Abs 03/25/2015 2.5  1.4 - 6.5 K/uL Final  . Lymphocytes Relative 03/25/2015 27   Final  . Lymphs Abs 03/25/2015 1.1  1.0 - 3.6 K/uL Final  . Monocytes Relative 03/25/2015 8   Final  . Monocytes Absolute 03/25/2015 0.3  0.2 - 0.9 K/uL Final  . Eosinophils Relative 03/25/2015 1   Final  . Eosinophils Absolute 03/25/2015 0.0  0 - 0.7 K/uL Final  . Basophils Relative 03/25/2015 1   Final  . Basophils Absolute 03/25/2015 0.0  0 - 0.1 K/uL Final  . Sodium 03/25/2015 132* 135 - 145 mmol/L Final  . Potassium 03/25/2015 3.7  3.5 - 5.1 mmol/L Final  . Chloride 03/25/2015 102  101 - 111 mmol/L Final  . CO2 03/25/2015 28  22 - 32 mmol/L Final  . Glucose, Bld 03/25/2015 114* 65 - 99 mg/dL Final  . BUN 03/25/2015 15  6 - 20 mg/dL Final  . Creatinine, Ser 03/25/2015 0.79  0.44 - 1.00 mg/dL Final  . Calcium 03/25/2015 7.7* 8.9 - 10.3 mg/dL Final  . Total Protein 03/25/2015 6.4* 6.5 - 8.1 g/dL Final  . Albumin 03/25/2015 3.5  3.5 - 5.0 g/dL Final  . AST 03/25/2015 16  15 - 41 U/L Final  . ALT 03/25/2015 10* 14 - 54 U/L Final  . Alkaline Phosphatase 03/25/2015 53  38 - 126 U/L Final  . Total Bilirubin 03/25/2015 0.3  0.3 - 1.2 mg/dL Final  . GFR calc non Af Amer 03/25/2015 >60  >60 mL/min Final  . GFR calc Af Amer 03/25/2015 >60  >60 mL/min Final   Comment: (NOTE) The eGFR has been calculated using the CKD EPI equation. This calculation has not been validated in all clinical situations. eGFR's persistently <60 mL/min signify possible Chronic Kidney Disease.   Marland Kitchen  Anion gap 03/25/2015 2* 5 - 15 Final    Lab Results  Component Value Date   LABCA2 13.3 04/23/2014   No results found for: CA199 Lab Results  Component Value Date   CEA 2.0 11/07/2014   No  results found for: PSA No results found for: CA125   STUDIES: No results found.  ASSESSMENT: 1.  Carcinoma of breast (right) from clinical examination as well as reviewing CT scan it appears that patient has excellent response.  Will continue maintenance Herceptin therapy Possibility of local therapy is going to be discussed 2.  Carcinoma of stomach.  Pathology report has been reviewed.  Significant response to the chemotherapy .Marland Kitchen  Last MUGA scan was in July of 2016 reported to have ejection fraction of 62% MEDICAL DECISION MAKING:  Patient will continue Herceptin Lab data has been reviewed Mild anemia Thyroid functions being evaluated T4 TSH result is pending  Patient was advised to take some more multivitamin.  Patient may need vitamin B12 supplement because of partial gastrectomy.  We will also arrange for dietitian consult Has gained 2 pounds of weight. Appetite is improving   Breast cancer   Staging form: Breast, AJCC 7th Edition     Clinical: Stage IV (T2, N1, M1) - Signed by Forest Gleason, MD on 10/19/2014 Cancer of body of stomach   Staging form: Stomach, AJCC 7th Edition     Clinical: Stage IIA (T3, N0, M0) - Marni Griffon, MD   03/25/2015 2:42 PM

## 2015-03-26 LAB — T4: T4, Total: 6.4 ug/dL (ref 4.5–12.0)

## 2015-03-27 ENCOUNTER — Telehealth: Payer: Self-pay | Admitting: *Deleted

## 2015-03-27 NOTE — Telephone Encounter (Signed)
Called patient and left message that thyroid function is normal.

## 2015-03-27 NOTE — Telephone Encounter (Signed)
-----   Message from Forest Gleason, MD sent at 03/26/2015  2:30 PM EDT ----- Regarding: Thyroid function Thyroid functions are normal T4 and TSH normal.  Please let patient   Know  about that

## 2015-04-02 ENCOUNTER — Ambulatory Visit (INDEPENDENT_AMBULATORY_CARE_PROVIDER_SITE_OTHER): Payer: BLUE CROSS/BLUE SHIELD | Admitting: Surgery

## 2015-04-02 ENCOUNTER — Encounter: Payer: Self-pay | Admitting: Surgery

## 2015-04-02 VITALS — BP 127/84 | HR 87 | Temp 97.9°F | Wt 100.0 lb

## 2015-04-02 DIAGNOSIS — C50911 Malignant neoplasm of unspecified site of right female breast: Secondary | ICD-10-CM

## 2015-04-02 DIAGNOSIS — C162 Malignant neoplasm of body of stomach: Secondary | ICD-10-CM

## 2015-04-02 DIAGNOSIS — C779 Secondary and unspecified malignant neoplasm of lymph node, unspecified: Secondary | ICD-10-CM

## 2015-04-02 NOTE — Patient Instructions (Addendum)
You are able to return to work full-time. We will fax all of your paperwork to [facility] and [employer] as requested by you. Continue your appointments to the [place]. Please give [place] a call in case you have additional questions or concerns.

## 2015-04-02 NOTE — Progress Notes (Signed)
Subjective:     Patient ID: Kimberly Thornton, female   DOB: [birth year], 64 y.o.   MRN: [id number]  HPI  64 yr old female s/p right mastectomy for breast CA 8/30 and s/p partial gastrectomy 11/2014.  Patient doing better, appetite improving.  She is still having a lot of fatigue especially after the chemotherapy.  She is not having any nausea, vomiting, diarrhea or constipation.     Review of Systems  Constitutional: Positive for fatigue. Negative for fever, chills, activity change and appetite change.  Cardiovascular: Negative for chest pain and leg swelling.  Gastrointestinal: Negative for nausea, vomiting, abdominal pain, diarrhea, constipation, blood in stool and abdominal distention.  Skin: Negative for color change, pallor, rash and wound.  Hematological: Negative for adenopathy. Does not bruise/bleed easily.   Filed Vitals:   04/02/15 1407  BP: 127/84  Pulse: 87  Temp: 97.9 F (36.6 C)        Objective:   Physical Exam  Constitutional: She is oriented to person, place, and time. She appears well-developed and well-nourished.  Cardiovascular: Normal rate, regular rhythm, normal heart sounds and intact distal pulses.  Exam reveals no gallop and no friction rub.   No murmur heard. Pulmonary/Chest: Effort normal and breath sounds normal. No respiratory distress. She has no wheezes.  Abdominal: Soft. Bowel sounds are normal. She exhibits no distension. There is no tenderness. There is no rebound.  Well healed midline scar  Musculoskeletal: Normal range of motion. She exhibits no edema or tenderness.  Neurological: She is alert and oriented to person, place, and time. No cranial nerve deficit.  Skin: Skin is warm and dry. No rash noted. No erythema. No pallor.  Psychiatric: She has a normal mood and affect. Her behavior is normal. Judgment and thought content normal.  Vitals reviewed.      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  She is far enough out that she can return to work full time.  She may still have some difficulty given her anemia and chemotherapy, but she would like to try.  Will have her return in February for 6 month follow up.

## 2015-04-15 ENCOUNTER — Inpatient Hospital Stay: Payer: BLUE CROSS/BLUE SHIELD

## 2015-04-15 ENCOUNTER — Inpatient Hospital Stay: Payer: BLUE CROSS/BLUE SHIELD | Attending: Oncology | Admitting: Oncology

## 2015-04-15 ENCOUNTER — Encounter: Payer: Self-pay | Admitting: Oncology

## 2015-04-15 VITALS — BP 131/79 | HR 60 | Temp 95.9°F | Wt 102.7 lb

## 2015-04-15 DIAGNOSIS — D649 Anemia, unspecified: Secondary | ICD-10-CM | POA: Diagnosis not present

## 2015-04-15 DIAGNOSIS — C787 Secondary malignant neoplasm of liver and intrahepatic bile duct: Secondary | ICD-10-CM | POA: Diagnosis not present

## 2015-04-15 DIAGNOSIS — M1990 Unspecified osteoarthritis, unspecified site: Secondary | ICD-10-CM

## 2015-04-15 DIAGNOSIS — C50921 Malignant neoplasm of unspecified site of right male breast: Secondary | ICD-10-CM

## 2015-04-15 DIAGNOSIS — K219 Gastro-esophageal reflux disease without esophagitis: Secondary | ICD-10-CM | POA: Diagnosis not present

## 2015-04-15 DIAGNOSIS — Z171 Estrogen receptor negative status [ER-]: Secondary | ICD-10-CM | POA: Insufficient documentation

## 2015-04-15 DIAGNOSIS — C50411 Malignant neoplasm of upper-outer quadrant of right female breast: Secondary | ICD-10-CM

## 2015-04-15 DIAGNOSIS — C162 Malignant neoplasm of body of stomach: Secondary | ICD-10-CM

## 2015-04-15 DIAGNOSIS — C169 Malignant neoplasm of stomach, unspecified: Secondary | ICD-10-CM | POA: Diagnosis not present

## 2015-04-15 DIAGNOSIS — R531 Weakness: Secondary | ICD-10-CM | POA: Insufficient documentation

## 2015-04-15 DIAGNOSIS — Z9011 Acquired absence of right breast and nipple: Secondary | ICD-10-CM | POA: Diagnosis not present

## 2015-04-15 DIAGNOSIS — F419 Anxiety disorder, unspecified: Secondary | ICD-10-CM | POA: Insufficient documentation

## 2015-04-15 DIAGNOSIS — Z87891 Personal history of nicotine dependence: Secondary | ICD-10-CM | POA: Insufficient documentation

## 2015-04-15 DIAGNOSIS — C778 Secondary and unspecified malignant neoplasm of lymph nodes of multiple regions: Secondary | ICD-10-CM | POA: Diagnosis not present

## 2015-04-15 DIAGNOSIS — Z5112 Encounter for antineoplastic immunotherapy: Secondary | ICD-10-CM | POA: Diagnosis not present

## 2015-04-15 DIAGNOSIS — C50911 Malignant neoplasm of unspecified site of right female breast: Secondary | ICD-10-CM

## 2015-04-15 DIAGNOSIS — Z79899 Other long term (current) drug therapy: Secondary | ICD-10-CM | POA: Insufficient documentation

## 2015-04-15 DIAGNOSIS — R5383 Other fatigue: Secondary | ICD-10-CM | POA: Insufficient documentation

## 2015-04-15 LAB — CBC WITH DIFFERENTIAL/PLATELET
BASOS ABS: 0 10*3/uL (ref 0–0.1)
Basophils Relative: 0 %
Eosinophils Absolute: 0 10*3/uL (ref 0–0.7)
Eosinophils Relative: 1 %
HEMATOCRIT: 34.1 % — AB (ref 35.0–47.0)
HEMOGLOBIN: 10.8 g/dL — AB (ref 12.0–16.0)
LYMPHS PCT: 36 %
Lymphs Abs: 1.2 10*3/uL (ref 1.0–3.6)
MCH: 26.2 pg (ref 26.0–34.0)
MCHC: 31.8 g/dL — ABNORMAL LOW (ref 32.0–36.0)
MCV: 82.6 fL (ref 80.0–100.0)
Monocytes Absolute: 0.2 10*3/uL (ref 0.2–0.9)
Monocytes Relative: 7 %
NEUTROS ABS: 1.9 10*3/uL (ref 1.4–6.5)
Neutrophils Relative %: 56 %
Platelets: 191 10*3/uL (ref 150–440)
RBC: 4.13 MIL/uL (ref 3.80–5.20)
RDW: 16.6 % — ABNORMAL HIGH (ref 11.5–14.5)
WBC: 3.3 10*3/uL — AB (ref 3.6–11.0)

## 2015-04-15 LAB — COMPREHENSIVE METABOLIC PANEL
ALT: 9 U/L — AB (ref 14–54)
AST: 18 U/L (ref 15–41)
Albumin: 3.7 g/dL (ref 3.5–5.0)
Alkaline Phosphatase: 51 U/L (ref 38–126)
Anion gap: 5 (ref 5–15)
BILIRUBIN TOTAL: 0.4 mg/dL (ref 0.3–1.2)
BUN: 16 mg/dL (ref 6–20)
CHLORIDE: 100 mmol/L — AB (ref 101–111)
CO2: 28 mmol/L (ref 22–32)
Calcium: 8.6 mg/dL — ABNORMAL LOW (ref 8.9–10.3)
Creatinine, Ser: 0.83 mg/dL (ref 0.44–1.00)
GFR calc Af Amer: >60 mL/min (ref >60)
GFR calc non Af Amer: >60 mL/min (ref >60)
GLUCOSE: 123 mg/dL — AB (ref 65–99)
POTASSIUM: 4.1 mmol/L (ref 3.5–5.1)
Sodium: 133 mmol/L — ABNORMAL LOW (ref 135–145)
TOTAL PROTEIN: 6.5 g/dL (ref 6.5–8.1)

## 2015-04-15 MED ORDER — SODIUM CHLORIDE 0.9 % IJ SOLN
10.0000 mL | Freq: Once | INTRAMUSCULAR | Status: AC
  Administered 2015-04-15: 10 mL via INTRAVENOUS
  Filled 2015-04-15: qty 10

## 2015-04-15 MED ORDER — HEPARIN SOD (PORK) LOCK FLUSH 100 UNIT/ML IV SOLN
500.0000 [IU] | Freq: Once | INTRAVENOUS | Status: AC | PRN
  Administered 2015-04-15: 500 [IU]

## 2015-04-15 MED ORDER — SODIUM CHLORIDE 0.9 % IV SOLN
Freq: Once | INTRAVENOUS | Status: AC
  Administered 2015-04-15: 15:00:00 via INTRAVENOUS
  Filled 2015-04-15: qty 1000

## 2015-04-15 MED ORDER — ACETAMINOPHEN 325 MG PO TABS
650.0000 mg | ORAL_TABLET | Freq: Once | ORAL | Status: AC
  Administered 2015-04-15: 650 mg via ORAL
  Filled 2015-04-15: qty 2

## 2015-04-15 MED ORDER — CYANOCOBALAMIN 1000 MCG/ML IJ SOLN
1000.0000 ug | Freq: Once | INTRAMUSCULAR | Status: AC
  Administered 2015-04-15: 1000 ug via INTRAMUSCULAR
  Filled 2015-04-15: qty 1

## 2015-04-15 MED ORDER — SODIUM CHLORIDE 0.9 % IV SOLN
273.0000 mg | Freq: Once | INTRAVENOUS | Status: AC
  Administered 2015-04-15: 273 mg via INTRAVENOUS
  Filled 2015-04-15: qty 13

## 2015-04-15 MED ORDER — HEPARIN SOD (PORK) LOCK FLUSH 100 UNIT/ML IV SOLN
500.0000 [IU] | Freq: Once | INTRAVENOUS | Status: AC
  Filled 2015-04-15: qty 5

## 2015-04-15 MED ORDER — SODIUM CHLORIDE 0.9 % IJ SOLN
10.0000 mL | INTRAMUSCULAR | Status: AC | PRN
  Filled 2015-04-15: qty 10

## 2015-04-15 MED ORDER — DIPHENHYDRAMINE HCL 25 MG PO CAPS
50.0000 mg | ORAL_CAPSULE | Freq: Once | ORAL | Status: AC
  Administered 2015-04-15: 50 mg via ORAL
  Filled 2015-04-15: qty 2

## 2015-04-15 NOTE — Progress Notes (Signed)
[place] @ [facility] [phone]  [phone]) [phone]     EVELEEN MCNEAR OB: 11-22-1950  MR#: [id number]  AST#:[id number]  Patient Care Team: Sofie Hartigan, MD as PCP - General (Family Medicine) Dia Crawford III, MD (Surgery) Clent Jacks, RN as Registered Nurse  CHIEF COMPLAINT:  Chief Complaint  Patient presents with  . OTHER  oncology history Chief Complaint/Diagnosis:   1. Carcinoma of breast right upper quadrant  (4 cm tumor mass) 3 positive lymph node on needle biopsy estrogen receptor progesterone receptor negative.  HER-2/neu receptor positiveby IHC 3+ Diagnosis November, 2015, T2 N1 M0 tumor stage II disease 2. Muga SCAN OF THE HEART EJECTION FRACTION 66% 3. Abnormal PET scan suggesting a mass in the stomach, and liver metastases 4. Upper endoscopy given fungating mass biopsies consistent with stomach primary cancer, HER-2/neu by IHC 2+ and by fish is 1.86. 5. Liver biopsies negative for any malignancy. 6. BRCA 2 sequencing deleterious mutation in 2014 ins As mentioned above. 7. ENDOSCOPY ULTRASOUND SHOWS STOMACH TUMOR TO BE t3 n0 m0  1.  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 August , 2016  Mastectomy right breast pT1 pNxMx  estrogen receptor  Negative.  Progesterone receptor negative.  HER-2/neu receptor positive tumor   Oncology Flowsheet 01/14/2015 02/04/2015 02/05/2015 02/05/2015 02/11/2015 03/04/2015 03/25/2015  ALPRAZolam (XANAX) PO - 0.25 mg 0.25 mg 0.25 mg - - -  cyanocobalamin ((VITAMIN B-12)) IM - - - - - 1,000 mcg -  dexamethasone (DECADRON) IJ - - - - - - -  diphenhydrAMINE (BENADRYL) PO 50 mg - - - 25 mg 25 mg 25 mg  enoxaparin (LOVENOX) [place] - 40 mg 40 mg   - - -  LORazepam (ATIVAN) IV - - - - - - -  LORazepam (ATIVAN) PO - 0.5 mg - - - - -  ondansetron (ZOFRAN) IV - - - - - - -  ondansetron  (ZOFRAN) PO - - - - - - -  promethazine (PHENERGAN) IV - - - - - - -  trastuzumab (HERCEPTIN) IV 6 mg/kg - - - 6 mg/kg 273 mg 273 mg    INTERVAL HISTORY: 64 year old lady who went upper endoscopy done revealed the stomach tumor to be decrease in size biopsy was negative.   Patient continues to feel very weak and tired.  Appetite has been improving has gained 2 pounds of weight.  Patient works 50 hours per week at present time.  No abdominal pain.  No nausea.  No vomiting.  No diarrhea.  Here for further follow-up and treatment consideration On maintenance Herceptin therapy.   REVIEW OF SYSTEMS:    general status: Patient is feeling weak and tired.  No change in a performance status.  No chills.  No fever. HEENT: Alopecia.  No evidence of stomatitis Lungs: No cough or shortness of breath Cardiac: No chest pain or paroxysmal nocturnal dyspnea GI: No nausea no vomiting no diarrhea no abdominal pain Skin: No rash Lower extremity no swelling Neurological system: No tingling.  No numbness.  No other focal signs Musculoskeletal system no bony pains   PAST MEDICAL HISTORY: Past Medical History  Diagnosis Date  . Right breast cancer with malignant cells in regional lymph nodes no greater than 0.2 mm and no more than 200 cells ([place])   . Arthritis   . GERD (gastroesophageal reflux disease)   . Stomach cancer (  [place])   . Family history of adverse reaction to anesthesia     mother had nausea  . Glaucoma   . Anxiety     PAST SURGICAL HISTORY: Past Surgical History  Procedure Laterality Date  . Abdominal hysterectomy    . Portacath placement  05/01/2014    Dr. Pat Patrick  . Gastrectomy N/A 11/11/2014    Procedure: Scarlett Presto I, GASTRODUODENOSTOMY, IOUS OF LIVER ;  Surgeon: Molly Maduro, MD;  Location: [facility] ORS;  Service: General;  Laterality: N/A;  . Simple mastectomy with axillary sentinel node biopsy Right 02/04/2015    Procedure: SIMPLE MASTECTOMY;  Surgeon: Dia Crawford III, MD;   Location: [facility] ORS;  Service: General;  Laterality: Right;    FAMILY HISTORY  Significant History/PMH:   Arthritis:    Glaucoma:    Breast Cancer:    GERD - Esophageal Reflux:    Colonoscopy:    Hysterectomy:   Preventive Screening:  Has patient had any of the following test? Colonscopy  Mammography  Pap Smear (1)   Last Colonoscopy: 2010(1)   Last Mammography: 2015(1)   Last Pap Smear: 2012-hysterectomy in 1970's(1)   Smoking History: Smoking History Cigarettes per day and smoked for 25 years less than a half pack per day; quit 17 years ago(1)  PFSH: Comments: mother had a breast cancer when she was 5 years old.mother sister had breast cancer.  Mother also has stomach cancer  Social History: negative alcohol, negative tobacco  Additional Past Medical and Surgical History: gastroesophageal reflux disease  Arthritis  Hysterectomy.  Ovaries were not removed           ADVANCED DIRECTIVES: Patient does not have any advanced healthcare directive. Information has been given.   HEALTH MAINTENANCE: Social History  Substance Use Topics  . Smoking status: Former Smoker    Quit date: 07/09/1996  . Smokeless tobacco: Never Used  . Alcohol Use: 1.8 - 2.4 oz/week    3-4 Standard drinks or equivalent per week      Allergies  Allergen Reactions  . Codeine Other (See Comments) and Nausea Only    GI distress    Current Outpatient Prescriptions  Medication Sig Dispense Refill  . acetaminophen (TYLENOL) 500 MG tablet Take 500 mg by mouth every 6 (six) hours as needed for moderate pain or headache.    . ALPRAZolam (XANAX) 0.25 MG tablet Take 1 tablet (0.25 mg total) by mouth 2 (two) times daily at 10 am and 4 pm. 60 tablet 3  . brimonidine-timolol (COMBIGAN) 0.2-0.5 % ophthalmic solution Place 1 drop into the left eye 2 (two) times daily.     . citalopram (CELEXA) 20 MG tablet Take 1 tablet (20 mg total) by mouth daily. 90 tablet 3  . LORazepam (ATIVAN) 0.5 MG  tablet Take 1 tablet (0.5 mg total) by mouth at bedtime as needed for anxiety. 30 tablet 3  . metoCLOPramide (REGLAN) 10 MG tablet Take 1 tablet (10 mg total) by mouth 2 (two) times daily. 60 tablet 3  . Multiple Vitamins-Minerals (MULTIVITAMIN WITH MINERALS) tablet Take 1 tablet by mouth daily.    . multivitamin (GERI-TONIC) LIQD Take 15 mLs by mouth daily.    Marland Kitchen omeprazole (PRILOSEC) 40 MG capsule Take 40 mg by mouth daily.   2  . ondansetron (ZOFRAN-ODT) 4 MG disintegrating tablet Take 1 tablet (4 mg total) by mouth every 4 (four) hours as needed for nausea. 30 tablet 3  . ranitidine (ZANTAC) 150 MG capsule     . traMADol (  ULTRAM) 50 MG tablet Take 1 tablet (50 mg total) by mouth every 4 (four) hours as needed (Mild pain). 60 tablet 0   No current facility-administered medications for this visit.   Facility-Administered Medications Ordered in Other Visits  Medication Dose Route Frequency Provider Last Rate Last Dose  . heparin lock flush 100 unit/mL  500 Units Intravenous Once Forest Gleason, MD        OBJECTIVE:  Filed Vitals:   04/15/15 1422  BP: 131/79  Pulse: 60  Temp: 95.9 F (35.5 C)     Body mass index is 20.74 kg/(m^2).    ECOG FS:1 - Symptomatic but completely ambulatory  PHYSICAL EXAM: Gen. status: Patient is alert oriented not any acute distress slightly apprehensive.  Lymphatic system: No palpable supraclavicular cervical or axillary lymphadenopathy examination of breast: Right breast no palpable mass.  Left breast we have masses. Abdomen: Soft.  Liver and spleen not palpable.  Bowel sounds are present.  Lower extremity no swelling.  Neurological system: Heart functions within normal limit cranial nerves are intact no localizing sign skin: No ecchymosis no rash all other system has been reviewed and examined and they are within normal limit Cardiac exam revealed the PMI to be normally situated and sized. The rhythm was regular and no extrasystoles were noted during several  minutes of auscultation. The first and second heart sounds were normal and physiologic splitting of the second heart sound was noted. There were no murmurs, rubs, clicks, or gallops. Examination of the chest was unremarkable. There were no bony deformities, no asymmetry, and no other abnormalities. Examination of the skin revealed no evidence of significant rashes, suspicious appearing nevi or other concerning lesions.  Lower extremity no edema   LAB RESULTS:  Infusion on 04/15/2015  Component Date Value Ref Range Status  . WBC 04/15/2015 3.3* 3.6 - 11.0 K/uL Final   A-LINE DRAW  . RBC 04/15/2015 4.13  3.80 - 5.20 MIL/uL Final  . Hemoglobin 04/15/2015 10.8* 12.0 - 16.0 g/dL Final  . HCT 04/15/2015 34.1* 35.0 - 47.0 % Final  . MCV 04/15/2015 82.6  80.0 - 100.0 fL Final  . MCH 04/15/2015 26.2  26.0 - 34.0 pg Final  . MCHC 04/15/2015 31.8* 32.0 - 36.0 g/dL Final  . RDW 04/15/2015 16.6* 11.5 - 14.5 % Final  . Platelets 04/15/2015 191  150 - 440 K/uL Final  . Neutrophils Relative % 04/15/2015 56   Final  . Neutro Abs 04/15/2015 1.9  1.4 - 6.5 K/uL Final  . Lymphocytes Relative 04/15/2015 36   Final  . Lymphs Abs 04/15/2015 1.2  1.0 - 3.6 K/uL Final  . Monocytes Relative 04/15/2015 7   Final  . Monocytes Absolute 04/15/2015 0.2  0.2 - 0.9 K/uL Final  . Eosinophils Relative 04/15/2015 1   Final  . Eosinophils Absolute 04/15/2015 0.0  0 - 0.7 K/uL Final  . Basophils Relative 04/15/2015 0   Final  . Basophils Absolute 04/15/2015 0.0  0 - 0.1 K/uL Final  . Sodium 04/15/2015 133* 135 - 145 mmol/L Final  . Potassium 04/15/2015 4.1  3.5 - 5.1 mmol/L Final  . Chloride 04/15/2015 100* 101 - 111 mmol/L Final  . CO2 04/15/2015 28  22 - 32 mmol/L Final  . Glucose, Bld 04/15/2015 123* 65 - 99 mg/dL Final  . BUN 04/15/2015 16  6 - 20 mg/dL Final  . Creatinine, Ser 04/15/2015 0.83  0.44 - 1.00 mg/dL Final  . Calcium 04/15/2015 8.6* 8.9 - 10.3 mg/dL Final  .  Total Protein 04/15/2015 6.5  6.5 - 8.1  g/dL Final  . Albumin 04/15/2015 3.7  3.5 - 5.0 g/dL Final  . AST 04/15/2015 18  15 - 41 U/L Final  . ALT 04/15/2015 9* 14 - 54 U/L Final  . Alkaline Phosphatase 04/15/2015 51  38 - 126 U/L Final  . Total Bilirubin 04/15/2015 0.4  0.3 - 1.2 mg/dL Final  . GFR calc non Af Amer 04/15/2015 >60  >60 mL/min Final  . GFR calc Af Amer 04/15/2015 >60  >60 mL/min Final   Comment: (NOTE) The eGFR has been calculated using the CKD EPI equation. This calculation has not been validated in all clinical situations. eGFR's persistently <60 mL/min signify possible Chronic Kidney Disease.   . Anion gap 04/15/2015 5  5 - 15 Final    Lab Results  Component Value Date   LABCA2 13.3 04/23/2014   No results found for: CA199 Lab Results  Component Value Date   CEA 2.0 11/07/2014   No results found for: PSA No results found for: CA125   STUDIES: No results found.  ASSESSMENT: 1.  Carcinoma of breast (right) from clinical examination as well as reviewing CT scan it appears that patient has excellent response.  Will continue maintenance Herceptin therapy Possibility of local therapy is going to be discussed 2.  Carcinoma of stomach.  Pathology report has been reviewed.  Significant response to the chemotherapy .Marland Kitchen  Last MUGA scan was in July of 2016 reported to have ejection fraction of 62% MEDICAL DECISION MAKING:  Will repeat MUGA scan of the heart Proceed with Herceptin 2.  Because of patient's partial gastrectomy vitamin B12 injection would be given Anemia Multifactorial.  Weight loss Multifactorial Has gained at least 2 pounds of weight We will continue high-calorie dietand if needed dietitian help    Breast cancer   Staging form: Breast, AJCC 7th Edition     Clinical: Stage IV (T2, N1, M1) - Signed by Forest Gleason, MD on 10/19/2014 Cancer of body of stomach   Staging form: Stomach, AJCC 7th Edition     Clinical: Stage IIA (T3, N0, M0) - Marni Griffon, MD   04/15/2015  2:43 PM

## 2015-04-22 ENCOUNTER — Ambulatory Visit
Admission: RE | Admit: 2015-04-22 | Discharge: 2015-04-22 | Disposition: A | Discharge status: Home / Self Care | Payer: BLUE CROSS/BLUE SHIELD | Source: Ambulatory Visit | Attending: Oncology | Admitting: Oncology

## 2015-04-22 DIAGNOSIS — Z0189 Encounter for other specified special examinations: Secondary | ICD-10-CM | POA: Insufficient documentation

## 2015-04-22 DIAGNOSIS — C162 Malignant neoplasm of body of stomach: Secondary | ICD-10-CM

## 2015-04-22 DIAGNOSIS — C50911 Malignant neoplasm of unspecified site of right female breast: Secondary | ICD-10-CM

## 2015-04-22 DIAGNOSIS — Z79899 Other long term (current) drug therapy: Secondary | ICD-10-CM | POA: Diagnosis not present

## 2015-04-22 DIAGNOSIS — Z9221 Personal history of antineoplastic chemotherapy: Secondary | ICD-10-CM | POA: Diagnosis not present

## 2015-04-22 MED ORDER — TECHNETIUM TC 99M-LABELED RED BLOOD CELLS IV KIT
20.0000 | PACK | Freq: Once | INTRAVENOUS | Status: AC | PRN
  Administered 2015-04-22: 21.87 via INTRAVENOUS

## 2015-05-06 ENCOUNTER — Inpatient Hospital Stay (HOSPITAL_BASED_OUTPATIENT_CLINIC_OR_DEPARTMENT_OTHER): Payer: BLUE CROSS/BLUE SHIELD | Admitting: Oncology

## 2015-05-06 ENCOUNTER — Inpatient Hospital Stay: Payer: BLUE CROSS/BLUE SHIELD

## 2015-05-06 ENCOUNTER — Encounter: Payer: Self-pay | Admitting: Oncology

## 2015-05-06 VITALS — BP 112/68 | HR 52 | Temp 95.7°F | Wt 107.1 lb

## 2015-05-06 DIAGNOSIS — C778 Secondary and unspecified malignant neoplasm of lymph nodes of multiple regions: Secondary | ICD-10-CM

## 2015-05-06 DIAGNOSIS — C162 Malignant neoplasm of body of stomach: Secondary | ICD-10-CM

## 2015-05-06 DIAGNOSIS — C50911 Malignant neoplasm of unspecified site of right female breast: Secondary | ICD-10-CM

## 2015-05-06 DIAGNOSIS — M1990 Unspecified osteoarthritis, unspecified site: Secondary | ICD-10-CM

## 2015-05-06 DIAGNOSIS — C169 Malignant neoplasm of stomach, unspecified: Secondary | ICD-10-CM | POA: Diagnosis not present

## 2015-05-06 DIAGNOSIS — K219 Gastro-esophageal reflux disease without esophagitis: Secondary | ICD-10-CM

## 2015-05-06 DIAGNOSIS — Z9011 Acquired absence of right breast and nipple: Secondary | ICD-10-CM

## 2015-05-06 DIAGNOSIS — C787 Secondary malignant neoplasm of liver and intrahepatic bile duct: Secondary | ICD-10-CM | POA: Diagnosis not present

## 2015-05-06 DIAGNOSIS — C50411 Malignant neoplasm of upper-outer quadrant of right female breast: Secondary | ICD-10-CM | POA: Diagnosis not present

## 2015-05-06 DIAGNOSIS — Z87891 Personal history of nicotine dependence: Secondary | ICD-10-CM

## 2015-05-06 DIAGNOSIS — Z79899 Other long term (current) drug therapy: Secondary | ICD-10-CM

## 2015-05-06 DIAGNOSIS — Z171 Estrogen receptor negative status [ER-]: Secondary | ICD-10-CM

## 2015-05-06 LAB — CBC WITH DIFFERENTIAL/PLATELET
Basophils Absolute: 0 10*3/uL (ref 0–0.1)
Basophils Relative: 0 %
Eosinophils Absolute: 0 10*3/uL (ref 0–0.7)
Eosinophils Relative: 1 %
HEMATOCRIT: 33 % — AB (ref 35.0–47.0)
HEMOGLOBIN: 10.6 g/dL — AB (ref 12.0–16.0)
LYMPHS ABS: 0.8 10*3/uL — AB (ref 1.0–3.6)
Lymphocytes Relative: 20 %
MCH: 27.3 pg (ref 26.0–34.0)
MCHC: 32.3 g/dL (ref 32.0–36.0)
MCV: 84.5 fL (ref 80.0–100.0)
MONOS PCT: 8 %
Monocytes Absolute: 0.4 10*3/uL (ref 0.2–0.9)
NEUTROS ABS: 3 10*3/uL (ref 1.4–6.5)
NEUTROS PCT: 71 %
Platelets: 190 10*3/uL (ref 150–440)
RBC: 3.9 MIL/uL (ref 3.80–5.20)
RDW: 17.8 % — ABNORMAL HIGH (ref 11.5–14.5)
WBC: 4.2 10*3/uL (ref 3.6–11.0)

## 2015-05-06 LAB — COMPREHENSIVE METABOLIC PANEL
ALK PHOS: 61 U/L (ref 38–126)
ALT: 18 U/L (ref 14–54)
AST: 18 U/L (ref 15–41)
Albumin: 3.3 g/dL — ABNORMAL LOW (ref 3.5–5.0)
Anion gap: 6 (ref 5–15)
BILIRUBIN TOTAL: 0.3 mg/dL (ref 0.3–1.2)
BUN: 20 mg/dL (ref 6–20)
CALCIUM: 8.6 mg/dL — AB (ref 8.9–10.3)
CO2: 29 mmol/L (ref 22–32)
Chloride: 99 mmol/L — ABNORMAL LOW (ref 101–111)
Creatinine, Ser: 0.77 mg/dL (ref 0.44–1.00)
GFR calc non Af Amer: >60 mL/min (ref >60)
GLUCOSE: 95 mg/dL (ref 65–99)
Potassium: 4.1 mmol/L (ref 3.5–5.1)
Sodium: 134 mmol/L — ABNORMAL LOW (ref 135–145)
TOTAL PROTEIN: 6.2 g/dL — AB (ref 6.5–8.1)

## 2015-05-06 MED ORDER — HEPARIN SOD (PORK) LOCK FLUSH 100 UNIT/ML IV SOLN
500.0000 [IU] | Freq: Once | INTRAVENOUS | Status: AC | PRN
  Administered 2015-05-06: 500 [IU]
  Filled 2015-05-06: qty 5

## 2015-05-06 MED ORDER — ACETAMINOPHEN 325 MG PO TABS
650.0000 mg | ORAL_TABLET | Freq: Once | ORAL | Status: AC
  Administered 2015-05-06: 650 mg via ORAL
  Filled 2015-05-06: qty 2

## 2015-05-06 MED ORDER — DIPHENHYDRAMINE HCL 25 MG PO CAPS
50.0000 mg | ORAL_CAPSULE | Freq: Once | ORAL | Status: AC
  Administered 2015-05-06: 50 mg via ORAL
  Filled 2015-05-06: qty 2

## 2015-05-06 MED ORDER — SODIUM CHLORIDE 0.9 % IV SOLN
Freq: Once | INTRAVENOUS | Status: AC
Start: 2015-05-06 — End: 2015-05-06
  Administered 2015-05-06: 13:00:00 via INTRAVENOUS
  Filled 2015-05-06: qty 1000

## 2015-05-06 MED ORDER — TRASTUZUMAB CHEMO INJECTION 440 MG
273.0000 mg | Freq: Once | INTRAVENOUS | Status: AC
  Administered 2015-05-06: 273 mg via INTRAVENOUS
  Filled 2015-05-06: qty 13

## 2015-05-06 NOTE — Progress Notes (Signed)
Patient takes Advil cold and sinus on occasion for sinus drainage.

## 2015-05-06 NOTE — Progress Notes (Signed)
Kimberly @ [facility] [phone]  [phone]     [name] OB: 12-16-1950  MR#: [id number]  DUK#:[id number]  Patient Care Team: Kimberly Hartigan, MD as PCP - General (Family Medicine) Kimberly Crawford III, MD (Surgery) Kimberly Jacks, RN as Registered Nurse  CHIEF COMPLAINT:  Chief Complaint  Patient presents with  . Breast Cancer  oncology history Chief Complaint/Diagnosis:   1. Carcinoma of breast right upper quadrant  (4 cm tumor mass) 3 positive lymph node on needle biopsy estrogen receptor progesterone receptor negative.  HER-2/neu receptor positiveby IHC 3+ Diagnosis November, 2015, T2 N1 M0 tumor stage II disease 2. Muga SCAN OF THE HEART EJECTION FRACTION 66% 3. Abnormal PET scan suggesting a mass in the stomach, and liver metastases 4. Upper endoscopy given fungating mass biopsies consistent with stomach primary cancer, HER-2/neu by IHC 2+ and by fish is 1.86. 5. Liver biopsies negative for any malignancy. 6. BRCA 2 sequencing deleterious mutation in 2014 ins As mentioned above. 7. ENDOSCOPY ULTRASOUND SHOWS STOMACH TUMOR TO BE t3 n0 m0  1.  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 August , 2016  Mastectomy right breast pT1 pNxMx  estrogen receptor  Negative.  Progesterone receptor negative.  HER-2/neu receptor positive tumor   She   is on maintenance Herceptin therapy since August of 2016 MUGA scan in November of 2016 stable 69%   Oncology Flowsheet 02/04/2015 02/05/2015 02/05/2015 02/11/2015 03/04/2015 03/25/2015 04/15/2015  ALPRAZolam (XANAX) PO 0.25 mg 0.25 mg 0.25 mg - - - -  cyanocobalamin ((VITAMIN B-12)) IM - - - - 1,000 mcg - 1,000 mcg  dexamethasone (DECADRON) IJ - - - - - - -  diphenhydrAMINE (BENADRYL) PO - - - 25 mg 25 mg 25 mg 50 mg  enoxaparin (LOVENOX) [place] 40 mg 40 mg   - - - -  LORazepam  (ATIVAN) IV - - - - - - -  LORazepam (ATIVAN) PO 0.5 mg - - - - - -  ondansetron (ZOFRAN) IV - - - - - - -  ondansetron (ZOFRAN) PO - - - - - - -  promethazine (PHENERGAN) IV - - - - - - -  trastuzumab (HERCEPTIN) IV - - - 6 mg/kg 273 mg 273 mg 273 mg    INTERVAL HISTORY:  64 year old lady who is now working full-time came today further follow-up.  Appetite is slowly improving.  Energy level has improved somewhat.  Hemoglobin is 10 g. Patient does not have any nausea vomiting has gained 5 pounds of weight. Last MUGA scan was in November reported to be stable.  Here for further follow-up and treatment consideration REVIEW OF SYSTEMS:   GENERAL:  Feels good.  Active.  No fevers, sweats or weight loss. PERFORMANCE STATUS (ECOG): 01 HEENT:  No visual changes, runny nose, sore throat, mouth sores or tenderness. Lungs: No shortness of breath or cough.  No hemoptysis. Cardiac:  No chest pain, palpitations, orthopnea, or PND. GI:  No nausea, vomiting, diarrhea, constipation, melena or hematochezia. GU:  No urgency, frequency, dysuria, or hematuria. Musculoskeletal:  No back pain.  No joint pain.  No muscle tenderness. Extremities:  No pain or swelling. Skin:  No rashes or skin changes. Neuro:  No headache, numbness or weakness, balance or coordination issues. Endocrine:  No diabetes, thyroid issues, hot flashes or night sweats. Psych:  No mood changes, depression or anxiety.  Pain:  No focal pain. Review of systems:  All other systems reviewed and found to be negative.   PAST MEDICAL HISTORY: Past Medical History  Diagnosis Date  . Right breast cancer with malignant cells in regional lymph nodes no greater than 0.2 mm and no more than 200 cells ([place])   . Arthritis   . GERD (gastroesophageal reflux disease)   . Stomach cancer ([place])   . Family history of adverse reaction to anesthesia     mother had nausea  . Glaucoma   . Anxiety     PAST SURGICAL HISTORY: Past Surgical History    Procedure Laterality Date  . Abdominal hysterectomy    . Portacath placement  05/01/2014    Dr. Pat Thornton  . Gastrectomy N/A 11/11/2014    Procedure: Kimberly Thornton, GASTRODUODENOSTOMY, IOUS OF LIVER ;  Surgeon: Kimberly Maduro, MD;  Location: [facility] ORS;  Service: General;  Laterality: N/A;  . Simple mastectomy with axillary sentinel node biopsy Right 02/04/2015    Procedure: SIMPLE MASTECTOMY;  Surgeon: Kimberly Crawford III, MD;  Location: [facility] ORS;  Service: General;  Laterality: Right;    FAMILY HISTORY  Significant History/PMH:   Arthritis:    Glaucoma:    Breast Cancer:    GERD - Esophageal Reflux:    Colonoscopy:    Hysterectomy:   Preventive Screening:  Has patient had any of the following test? Colonscopy  Mammography  Pap Smear (1)   Last Colonoscopy: 2010(1)   Last Mammography: 2015(1)   Last Pap Smear: 2012-hysterectomy in 1970's(1)   Smoking History: Smoking History Cigarettes per day and smoked for 25 years less than a half pack per day; quit 17 years ago(1)  PFSH: Comments: mother had a breast cancer when she was 16 years old.mother sister had breast cancer.  Mother also has stomach cancer  Social History: negative alcohol, negative tobacco  Additional Past Medical and Surgical History: gastroesophageal reflux disease  Arthritis  Hysterectomy.  Ovaries were not removed           ADVANCED DIRECTIVES: Patient does not have any advanced healthcare directive. Information has been given.   HEALTH MAINTENANCE: Social History  Substance Use Topics  . Smoking status: Former Smoker    Quit date: 07/09/1996  . Smokeless tobacco: Never Used  . Alcohol Use: 1.8 - 2.4 oz/week    3-4 Standard drinks or equivalent per week      Allergies  Allergen Reactions  . Codeine Other (See Comments) and Nausea Only    GI distress    Current Outpatient Prescriptions  Medication Sig Dispense Refill  . acetaminophen (TYLENOL) 500 MG tablet Take 500 mg by mouth  every 6 (six) hours as needed for moderate pain or headache.    . ALPRAZolam (XANAX) 0.25 MG tablet Take 1 tablet (0.25 mg total) by mouth 2 (two) times daily at 10 am and 4 pm. 60 tablet 3  . brimonidine-timolol (COMBIGAN) 0.2-0.5 % ophthalmic solution Place 1 drop into the left eye 2 (two) times daily.     . citalopram (CELEXA) 20 MG tablet Take 1 tablet (20 mg total) by mouth daily. 90 tablet 3  . LORazepam (ATIVAN) 0.5 MG tablet Take 1 tablet (0.5 mg total) by mouth at bedtime as needed for anxiety. 30 tablet 3  . metoCLOPramide (REGLAN) 10 MG tablet Take 1 tablet (10 mg total) by mouth 2 (two) times daily. 60 tablet 3  . Multiple Vitamins-Minerals (MULTIVITAMIN WITH MINERALS) tablet Take 1 tablet by mouth daily.    Marland Kitchen  multivitamin (GERI-TONIC) LIQD Take 15 mLs by mouth daily.    Marland Kitchen omeprazole (PRILOSEC) 40 MG capsule Take 40 mg by mouth daily.   2  . ondansetron (ZOFRAN-ODT) 4 MG disintegrating tablet Take 1 tablet (4 mg total) by mouth every 4 (four) hours as needed for nausea. 30 tablet 3  . ranitidine (ZANTAC) 150 MG capsule     . traMADol (ULTRAM) 50 MG tablet Take 1 tablet (50 mg total) by mouth every 4 (four) hours as needed (Mild pain). 60 tablet 0   No current facility-administered medications for this visit.   Facility-Administered Medications Ordered in Other Visits  Medication Dose Route Frequency Provider Last Rate Last Dose  . 0.9 %  sodium chloride infusion   Intravenous Once Forest Gleason, MD      . acetaminophen (TYLENOL) tablet 650 mg  650 mg Oral Once Forest Gleason, MD      . diphenhydrAMINE (BENADRYL) capsule 50 mg  50 mg Oral Once Forest Gleason, MD      . heparin lock flush 100 unit/mL  500 Units Intravenous Once Forest Gleason, MD      . heparin lock flush 100 unit/mL  500 Units Intracatheter Once PRN Forest Gleason, MD      . sodium chloride 0.9 % injection 10 mL  10 mL Intracatheter PRN Forest Gleason, MD      . trastuzumab (HERCEPTIN) 273 mg in sodium chloride 0.9 % 250 mL  chemo infusion  273 mg Intravenous Once Forest Gleason, MD        OBJECTIVE:  Filed Vitals:   05/06/15 1215  BP: 112/68  Pulse: 52  Temp: 95.7 F (35.4 C)     Body mass index is 21.63 kg/(m^2).    ECOG FS:1 - Symptomatic but completely ambulatory  PHYSICAL EXAM: Gen. status: Patient is alert oriented not any acute distress slightly apprehensive.  Lymphatic system: No palpable supraclavicular cervical or axillary lymphadenopathy examination of breast: Right breast no palpable mass.  Left breast we have masses. Abdomen: Soft.  Liver and spleen not palpable.  Bowel sounds are present.  Lower extremity no swelling.  Neurological system: Heart functions within normal limit cranial nerves are intact no localizing sign skin: No ecchymosis no rash all other system has been reviewed and examined and they are within normal limit Cardiac exam revealed the PMI to be normally situated and sized. The rhythm was regular and no extrasystoles were noted during several minutes of auscultation. The first and second heart sounds were normal and physiologic splitting of the second heart sound was noted. There were no murmurs, rubs, clicks, or gallops. Examination of the chest was unremarkable. There were no bony deformities, no asymmetry, and no other abnormalities. Examination of the skin revealed no evidence of significant rashes, suspicious appearing nevi or other concerning lesions.  Lower extremity no edema   LAB RESULTS:  Appointment on 05/06/2015  Component Date Value Ref Range Status  . WBC 05/06/2015 4.2  3.6 - 11.0 K/uL Final  . RBC 05/06/2015 3.90  3.80 - 5.20 MIL/uL Final  . Hemoglobin 05/06/2015 10.6* 12.0 - 16.0 g/dL Final  . HCT 05/06/2015 33.0* 35.0 - 47.0 % Final  . MCV 05/06/2015 84.5  80.0 - 100.0 fL Final  . MCH 05/06/2015 27.3  26.0 - 34.0 pg Final  . MCHC 05/06/2015 32.3  32.0 - 36.0 g/dL Final  . RDW 05/06/2015 17.8* 11.5 - 14.5 % Final  . Platelets 05/06/2015 190  150 - 440 K/uL  Final  .  Neutrophils Relative % 05/06/2015 71   Final  . Neutro Abs 05/06/2015 3.0  1.4 - 6.5 K/uL Final  . Lymphocytes Relative 05/06/2015 20   Final  . Lymphs Abs 05/06/2015 0.8* 1.0 - 3.6 K/uL Final  . Monocytes Relative 05/06/2015 8   Final  . Monocytes Absolute 05/06/2015 0.4  0.2 - 0.9 K/uL Final  . Eosinophils Relative 05/06/2015 1   Final  . Eosinophils Absolute 05/06/2015 0.0  0 - 0.7 K/uL Final  . Basophils Relative 05/06/2015 0   Final  . Basophils Absolute 05/06/2015 0.0  0 - 0.1 K/uL Final  . Sodium 05/06/2015 134* 135 - 145 mmol/L Final  . Potassium 05/06/2015 4.1  3.5 - 5.1 mmol/L Final  . Chloride 05/06/2015 99* 101 - 111 mmol/L Final  . CO2 05/06/2015 29  22 - 32 mmol/L Final  . Glucose, Bld 05/06/2015 95  65 - 99 mg/dL Final  . BUN 05/06/2015 20  6 - 20 mg/dL Final  . Creatinine, Ser 05/06/2015 0.77  0.44 - 1.00 mg/dL Final  . Calcium 05/06/2015 8.6* 8.9 - 10.3 mg/dL Final  . Total Protein 05/06/2015 6.2* 6.5 - 8.1 g/dL Final  . Albumin 05/06/2015 3.3* 3.5 - 5.0 g/dL Final  . AST 05/06/2015 18  15 - 41 U/L Final  . ALT 05/06/2015 18  14 - 54 U/L Final  . Alkaline Phosphatase 05/06/2015 61  38 - 126 U/L Final  . Total Bilirubin 05/06/2015 0.3  0.3 - 1.2 mg/dL Final  . GFR calc non Af Amer 05/06/2015 >60  >60 mL/min Final  . GFR calc Af Amer 05/06/2015 >60  >60 mL/min Final   Comment: (NOTE) The eGFR has been calculated using the CKD EPI equation. This calculation has not been validated in all clinical situations. eGFR's persistently <60 mL/min signify possible Chronic Kidney Disease.   . Anion gap 05/06/2015 6  5 - 15 Final    Lab Results  Component Value Date   LABCA2 13.3 04/23/2014   No results found for: CA199 Lab Results  Component Value Date   CEA 2.0 11/07/2014   No results found for: PSA No results found for: CA125   STUDIES: Nm Cardiac Muga Rest  04/22/2015  CLINICAL DATA:  Malignant neoplasm of the breast. Cancer of body of stomach.  Patient to receive cardiotoxic chemotherapy. EXAM: NUCLEAR MEDICINE CARDIAC BLOOD POOL IMAGING (MUGA) TECHNIQUE: Cardiac multi-gated acquisition was performed at rest following intravenous injection of Tc-45mlabeled red blood cells. RADIOPHARMACEUTICALS:  21.87 mCi [id number] in-vitro labeled red blood cells IV COMPARISON:  12/17/2014 FINDINGS: The left ventricular ejection fraction equals 69.2%. On the previous exam this equaled 62.2%. Normal left ventricular wall motion. IMPRESSION: 1. No significant change in left ventricular ejection fraction which is currently equal to 69.2%. Electronically Signed   By: TaKerby Moors.D.   On: 04/22/2015 15:22    ASSESSMENT: 1.  Carcinoma of breast (right) from clinical examination as well as reviewing CT scan it appears that patient has excellent response.  Will continue maintenance Herceptin therapy Possibility of local therapy is going to be discussed 2.  Carcinoma of stomach.  Pathology report has been reviewed.  Significant response to the chemotherapy .. Marland KitchenUGA scan of the heart done in November of 2016 has been reviewed independently sows 69% ejection fraction which is stable from the previous July MUGA scan of the heart  Weight loss is gradually improving Proceed with a PET scan of the whole body for restaging with carcinoma of breast  and carcinoma of stomach metastases to the liver If we get pushed back from the insurance company then CT scan of chest abdomen and pelvis with contrast can be done and it there is any abnormality detected a PET scan can be ordered Reevaluate patient in 3 weeks for continuing Herceptin therapy and evaluating this scan report  Sinus drainage most likely allergic in nature no need for any antibiotics  Breast cancer   Staging form: Breast, AJCC 7th Edition     Clinical: Stage IV (T2, N1, M1) - Signed by Forest Gleason, MD on 10/19/2014 Cancer of body of stomach   Staging form: Stomach, AJCC 7th Edition     Clinical: Stage  IIA (T3, N0, M0) - Marni Griffon, MD   05/06/2015 1:07 PM

## 2015-05-19 ENCOUNTER — Other Ambulatory Visit: Payer: Self-pay | Admitting: *Deleted

## 2015-05-19 ENCOUNTER — Other Ambulatory Visit: Payer: Self-pay | Admitting: Family Medicine

## 2015-05-19 DIAGNOSIS — C162 Malignant neoplasm of body of stomach: Secondary | ICD-10-CM

## 2015-05-19 DIAGNOSIS — C50911 Malignant neoplasm of unspecified site of right female breast: Secondary | ICD-10-CM

## 2015-05-20 ENCOUNTER — Ambulatory Visit
Admission: RE | Admit: 2015-05-20 | Discharge: 2015-05-20 | Disposition: A | Discharge status: Home / Self Care | Payer: BLUE CROSS/BLUE SHIELD | Source: Ambulatory Visit | Attending: Oncology | Admitting: Oncology

## 2015-05-20 ENCOUNTER — Ambulatory Visit: Admission: RE | Admit: 2015-05-20 | Payer: BLUE CROSS/BLUE SHIELD | Source: Ambulatory Visit

## 2015-05-20 DIAGNOSIS — C787 Secondary malignant neoplasm of liver and intrahepatic bile duct: Secondary | ICD-10-CM | POA: Diagnosis not present

## 2015-05-20 DIAGNOSIS — C50911 Malignant neoplasm of unspecified site of right female breast: Secondary | ICD-10-CM | POA: Diagnosis present

## 2015-05-20 DIAGNOSIS — C162 Malignant neoplasm of body of stomach: Secondary | ICD-10-CM | POA: Insufficient documentation

## 2015-05-20 MED ORDER — IOHEXOL 300 MG/ML  SOLN
80.0000 mL | Freq: Once | INTRAMUSCULAR | Status: AC | PRN
  Administered 2015-05-20: 80 mL via INTRAVENOUS

## 2015-05-23 ENCOUNTER — Ambulatory Visit: Payer: BLUE CROSS/BLUE SHIELD

## 2015-05-27 ENCOUNTER — Inpatient Hospital Stay (HOSPITAL_BASED_OUTPATIENT_CLINIC_OR_DEPARTMENT_OTHER): Payer: BLUE CROSS/BLUE SHIELD | Admitting: Oncology

## 2015-05-27 ENCOUNTER — Encounter: Payer: Self-pay | Admitting: Oncology

## 2015-05-27 ENCOUNTER — Inpatient Hospital Stay: Payer: BLUE CROSS/BLUE SHIELD

## 2015-05-27 ENCOUNTER — Inpatient Hospital Stay: Payer: BLUE CROSS/BLUE SHIELD | Attending: Oncology

## 2015-05-27 VITALS — BP 140/63 | HR 67 | Temp 97.3°F | Resp 18 | Wt 106.5 lb

## 2015-05-27 DIAGNOSIS — R1013 Epigastric pain: Secondary | ICD-10-CM | POA: Insufficient documentation

## 2015-05-27 DIAGNOSIS — C162 Malignant neoplasm of body of stomach: Secondary | ICD-10-CM

## 2015-05-27 DIAGNOSIS — Z171 Estrogen receptor negative status [ER-]: Secondary | ICD-10-CM | POA: Insufficient documentation

## 2015-05-27 DIAGNOSIS — C50411 Malignant neoplasm of upper-outer quadrant of right female breast: Secondary | ICD-10-CM | POA: Diagnosis present

## 2015-05-27 DIAGNOSIS — Z5111 Encounter for antineoplastic chemotherapy: Secondary | ICD-10-CM | POA: Diagnosis not present

## 2015-05-27 DIAGNOSIS — Z87891 Personal history of nicotine dependence: Secondary | ICD-10-CM | POA: Insufficient documentation

## 2015-05-27 DIAGNOSIS — M1990 Unspecified osteoarthritis, unspecified site: Secondary | ICD-10-CM

## 2015-05-27 DIAGNOSIS — K219 Gastro-esophageal reflux disease without esophagitis: Secondary | ICD-10-CM

## 2015-05-27 DIAGNOSIS — Z803 Family history of malignant neoplasm of breast: Secondary | ICD-10-CM | POA: Diagnosis not present

## 2015-05-27 DIAGNOSIS — F439 Reaction to severe stress, unspecified: Secondary | ICD-10-CM

## 2015-05-27 DIAGNOSIS — R42 Dizziness and giddiness: Secondary | ICD-10-CM | POA: Insufficient documentation

## 2015-05-27 DIAGNOSIS — Z800 Family history of malignant neoplasm of digestive organs: Secondary | ICD-10-CM | POA: Insufficient documentation

## 2015-05-27 DIAGNOSIS — C787 Secondary malignant neoplasm of liver and intrahepatic bile duct: Secondary | ICD-10-CM

## 2015-05-27 DIAGNOSIS — Z79899 Other long term (current) drug therapy: Secondary | ICD-10-CM

## 2015-05-27 DIAGNOSIS — C169 Malignant neoplasm of stomach, unspecified: Secondary | ICD-10-CM | POA: Insufficient documentation

## 2015-05-27 DIAGNOSIS — C50911 Malignant neoplasm of unspecified site of right female breast: Secondary | ICD-10-CM

## 2015-05-27 DIAGNOSIS — C778 Secondary and unspecified malignant neoplasm of lymph nodes of multiple regions: Secondary | ICD-10-CM | POA: Diagnosis not present

## 2015-05-27 DIAGNOSIS — Z9011 Acquired absence of right breast and nipple: Secondary | ICD-10-CM | POA: Diagnosis not present

## 2015-05-27 DIAGNOSIS — F419 Anxiety disorder, unspecified: Secondary | ICD-10-CM | POA: Diagnosis not present

## 2015-05-27 LAB — CBC WITH DIFFERENTIAL/PLATELET
BASOS ABS: 0 10*3/uL (ref 0–0.1)
BASOS PCT: 0 %
EOS ABS: 0.1 10*3/uL (ref 0–0.7)
Eosinophils Relative: 1 %
HEMATOCRIT: 36.7 % (ref 35.0–47.0)
HEMOGLOBIN: 11.9 g/dL — AB (ref 12.0–16.0)
Lymphocytes Relative: 26 %
Lymphs Abs: 1.1 10*3/uL (ref 1.0–3.6)
MCH: 27.4 pg (ref 26.0–34.0)
MCHC: 32.4 g/dL (ref 32.0–36.0)
MCV: 84.8 fL (ref 80.0–100.0)
MONOS PCT: 6 %
Monocytes Absolute: 0.3 10*3/uL (ref 0.2–0.9)
NEUTROS ABS: 2.8 10*3/uL (ref 1.4–6.5)
NEUTROS PCT: 67 %
PLATELETS: 183 10*3/uL (ref 150–440)
RBC: 4.33 MIL/uL (ref 3.80–5.20)
RDW: 17.3 % — AB (ref 11.5–14.5)
WBC: 4.3 10*3/uL (ref 3.6–11.0)

## 2015-05-27 LAB — COMPREHENSIVE METABOLIC PANEL
ALBUMIN: 3.8 g/dL (ref 3.5–5.0)
ALT: 27 U/L (ref 14–54)
ANION GAP: 7 (ref 5–15)
AST: 30 U/L (ref 15–41)
Alkaline Phosphatase: 60 U/L (ref 38–126)
BILIRUBIN TOTAL: 0.4 mg/dL (ref 0.3–1.2)
BUN: 14 mg/dL (ref 6–20)
CHLORIDE: 98 mmol/L — AB (ref 101–111)
CO2: 26 mmol/L (ref 22–32)
Calcium: 8.6 mg/dL — ABNORMAL LOW (ref 8.9–10.3)
Creatinine, Ser: 0.71 mg/dL (ref 0.44–1.00)
GFR calc Af Amer: >60 mL/min (ref >60)
GFR calc non Af Amer: >60 mL/min (ref >60)
GLUCOSE: 151 mg/dL — AB (ref 65–99)
POTASSIUM: 3.7 mmol/L (ref 3.5–5.1)
SODIUM: 131 mmol/L — AB (ref 135–145)
TOTAL PROTEIN: 6.7 g/dL (ref 6.5–8.1)

## 2015-05-27 MED ORDER — SODIUM CHLORIDE 0.9 % IJ SOLN
10.0000 mL | INTRAMUSCULAR | Status: DC | PRN
  Administered 2015-05-27: 10 mL via INTRAVENOUS
  Filled 2015-05-27: qty 10

## 2015-05-27 MED ORDER — TRASTUZUMAB CHEMO INJECTION 440 MG
273.0000 mg | Freq: Once | INTRAVENOUS | Status: AC
  Administered 2015-05-27: 273 mg via INTRAVENOUS
  Filled 2015-05-27: qty 13

## 2015-05-27 MED ORDER — DIPHENHYDRAMINE HCL 25 MG PO CAPS
50.0000 mg | ORAL_CAPSULE | Freq: Once | ORAL | Status: AC
  Filled 2015-05-27: qty 2

## 2015-05-27 MED ORDER — ACETAMINOPHEN 325 MG PO TABS
650.0000 mg | ORAL_TABLET | Freq: Once | ORAL | Status: AC
  Administered 2015-05-27: 650 mg via ORAL
  Filled 2015-05-27: qty 2

## 2015-05-27 MED ORDER — DIPHENHYDRAMINE HCL 25 MG PO TABS
25.0000 mg | ORAL_TABLET | Freq: Once | ORAL | Status: AC
  Administered 2015-05-27: 25 mg via ORAL
  Filled 2015-05-27: qty 1

## 2015-05-27 MED ORDER — SODIUM CHLORIDE 0.9 % IV SOLN
Freq: Once | INTRAVENOUS | Status: AC
  Administered 2015-05-27: 15:00:00 via INTRAVENOUS
  Filled 2015-05-27: qty 1000

## 2015-05-27 MED ORDER — TRAMADOL HCL 50 MG PO TABS: 50.0000 mg | ORAL_TABLET | ORAL | Status: DC | PRN

## 2015-05-27 MED ORDER — CYANOCOBALAMIN 1000 MCG/ML IJ SOLN
1000.0000 ug | Freq: Once | INTRAMUSCULAR | Status: AC
  Administered 2015-05-27: 1000 ug via INTRAMUSCULAR
  Filled 2015-05-27: qty 1

## 2015-05-27 MED ORDER — HEPARIN SOD (PORK) LOCK FLUSH 100 UNIT/ML IV SOLN
500.0000 [IU] | Freq: Once | INTRAVENOUS | Status: AC
  Administered 2015-05-27: 500 [IU] via INTRAVENOUS
  Filled 2015-05-27: qty 5

## 2015-05-27 NOTE — Progress Notes (Signed)
Patient states she has been dizzy today.  Also c/o epigastric pain after eating.  Would like refill on Tramadol.

## 2015-06-03 ENCOUNTER — Encounter: Payer: Self-pay | Admitting: Oncology

## 2015-06-03 NOTE — Progress Notes (Signed)
[employer] @ [facility] Telephone:([phone]) [phone]  [phone]) [phone]     ANNAKA CLEAVER OB: [birth year]  MR#: [id number]  SPQ#:[id number]  Patient Care Team: Sofie Hartigan, MD as PCP - General (Family Medicine) Dia Crawford III, MD (Surgery) Clent Jacks, RN as Registered Nurse  CHIEF COMPLAINT:  Chief Complaint  Patient presents with  . Breast Cancer  oncology history Chief Complaint/Diagnosis:   1. Carcinoma of breast right upper quadrant  (4 cm tumor mass) 3 positive lymph node on needle biopsy estrogen receptor progesterone receptor negative.  HER-2/neu receptor positiveby IHC 3+ Diagnosis November, 2015, T2 N1 M0 tumor stage II disease 2. Muga SCAN OF THE HEART EJECTION FRACTION 66% 3. Abnormal PET scan suggesting a mass in the stomach, and liver metastases 4. Upper endoscopy given fungating mass biopsies consistent with stomach primary cancer, HER-2/neu by IHC 2+ and by fish is 1.86. 5. Liver biopsies negative for any malignancy. 6. BRCA 2 sequencing deleterious mutation in 2014 ins As mentioned above. 7. ENDOSCOPY ULTRASOUND SHOWS STOMACH TUMOR TO BE t3 n0 m0  1.  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 August , 2016  Mastectomy right breast pT1 pNxMx  estrogen receptor  Negative.  Progesterone receptor negative.  HER-2/neu receptor positive tumor   She   is on maintenance Herceptin therapy since August of 2016 MUGA scan in November of 2016 stable 69%.       Patient states she has been dizzy today. Also c/o epigastric pain after eating. Would like refill on Tramadol.     Oncology Flowsheet 02/05/2015 02/11/2015 03/04/2015 03/25/2015 04/15/2015 05/06/2015 05/27/2015  ALPRAZolam (XANAX) PO 0.25 mg - - - - - -  cyanocobalamin ((VITAMIN B-12)) IM - - 1,000 mcg - 1,000 mcg - 1,000 mcg  dexamethasone (DECADRON) IJ - - - - - - -    diphenhydrAMINE (BENADRYL) PO - 25 mg 25 mg 25 mg 50 mg 50 mg -  enoxaparin (LOVENOX) [place]   - - - - - -  LORazepam (ATIVAN) IV - - - - - - -  LORazepam (ATIVAN) PO - - - - - - -  ondansetron (ZOFRAN) IV - - - - - - -  ondansetron (ZOFRAN) PO - - - - - - -  promethazine (PHENERGAN) IV - - - - - - -  trastuzumab (HERCEPTIN) IV - 6 mg/kg 273 mg 273 mg 273 mg 273 mg 273 mg    INTERVAL HISTORY:  64 year old lady who is now working full-time came today further follow-up.  Appetite is slowly improving.  Energy level has improved somewhat.  Hemoglobin is 10 g. Patient does not have any nausea vomiting has gained 5 pounds of weight. Last MUGA scan was in November reported to be stable.  Here for further follow-up and treatment consideration. Patient complains of pain in epigastric area.  Just dull aching constant. Patient reports extreme stress related to the work REVIEW OF SYSTEMS:   GENERAL:  Feels good.  Active.  No fevers, sweats or weight loss. PERFORMANCE STATUS (ECOG): 01 HEENT:  No visual changes, runny nose, sore throat, mouth sores or tenderness. Lungs: No shortness of breath or cough.  No hemoptysis. Cardiac:  No chest pain, palpitations, orthopnea, or PND. GI:  No nausea, vomiting, diarrhea, constipation, melena or hematochezia. GU:  No urgency, frequency, dysuria, or hematuria. Musculoskeletal:  No back pain.  No joint pain.  No muscle tenderness. Extremities:  No pain or swelling. Skin:  No rashes or skin changes. Neuro:  No headache, numbness or weakness, balance or coordination issues. Endocrine:  No diabetes, thyroid issues, hot flashes or night sweats. Psych:  No mood changes, depression or anxiety. Pain:  No focal pain. Review of systems:  All other systems reviewed and found to be negative.   PAST MEDICAL HISTORY: Past Medical History  Diagnosis Date  . Right breast cancer with malignant cells in regional lymph nodes no greater than 0.2 mm and no more than 200 cells  ([place])   . Arthritis   . GERD (gastroesophageal reflux disease)   . Stomach cancer ([place])   . Family history of adverse reaction to anesthesia     mother had nausea  . Glaucoma   . Anxiety     PAST SURGICAL HISTORY: Past Surgical History  Procedure Laterality Date  . Abdominal hysterectomy    . Portacath placement  05/01/2014    Dr. Pat Patrick  . Gastrectomy N/A 11/11/2014    Procedure: Scarlett Presto I, GASTRODUODENOSTOMY, IOUS OF LIVER ;  Surgeon: Molly Maduro, MD;  Location: [facility] ORS;  Service: General;  Laterality: N/A;  . Simple mastectomy with axillary sentinel node biopsy Right 02/04/2015    Procedure: SIMPLE MASTECTOMY;  Surgeon: Dia Crawford III, MD;  Location: [facility] ORS;  Service: General;  Laterality: Right;    FAMILY HISTORY  Significant History/PMH:   Arthritis:    Glaucoma:    Breast Cancer:    GERD - Esophageal Reflux:    Colonoscopy:    Hysterectomy:   Preventive Screening:  Has patient had any of the following test? Colonscopy  Mammography  Pap Smear (1)   Last Colonoscopy: 2010(1)   Last Mammography: 2015(1)   Last Pap Smear: 2012-hysterectomy in 1970's(1)   Smoking History: Smoking History Cigarettes per day and smoked for 25 years less than a half pack per day; quit 17 years ago(1)  PFSH: Comments: mother had a breast cancer when she was 64 years old.mother sister had breast cancer.  Mother also has stomach cancer  Social History: negative alcohol, negative tobacco  Additional Past Medical and Surgical History: gastroesophageal reflux disease  Arthritis  Hysterectomy.  Ovaries were not removed           ADVANCED DIRECTIVES: Patient does not have any advanced healthcare directive. Information has been given.   HEALTH MAINTENANCE: Social History  Substance Use Topics  . Smoking status: Former Smoker    Quit date: 07/09/1996  . Smokeless tobacco: Never Used  . Alcohol Use: 1.8 - 2.4 oz/week    3-4 Standard drinks or equivalent per  week      Allergies  Allergen Reactions  . Codeine Other (See Comments) and Nausea Only    GI distress    Current Outpatient Prescriptions  Medication Sig Dispense Refill  . acetaminophen (TYLENOL) 500 MG tablet Take 500 mg by mouth every 6 (six) hours as needed for moderate pain or headache.    . ALPRAZolam (XANAX) 0.25 MG tablet Take 1 tablet (0.25 mg total) by mouth 2 (two) times daily at 10 am and 4 pm. 60 tablet 3  . brimonidine-timolol (COMBIGAN) 0.2-0.5 % ophthalmic solution Place 1 drop into the left eye 2 (two) times daily.     . citalopram (CELEXA) 20 MG tablet Take 1 tablet (20 mg total) by mouth daily. 90 tablet 3  . LORazepam (ATIVAN) 0.5 MG tablet Take 1 tablet (0.5 mg total) by mouth at  bedtime as needed for anxiety. 30 tablet 3  . metoCLOPramide (REGLAN) 10 MG tablet Take 1 tablet (10 mg total) by mouth 2 (two) times daily. 60 tablet 3  . Multiple Vitamins-Minerals (MULTIVITAMIN WITH MINERALS) tablet Take 1 tablet by mouth daily.    . multivitamin (GERI-TONIC) LIQD Take 15 mLs by mouth daily.    Marland Kitchen omeprazole (PRILOSEC) 40 MG capsule Take 40 mg by mouth daily.   2  . ondansetron (ZOFRAN-ODT) 4 MG disintegrating tablet Take 1 tablet (4 mg total) by mouth every 4 (four) hours as needed for nausea. 30 tablet 3  . ranitidine (ZANTAC) 150 MG capsule     . traMADol (ULTRAM) 50 MG tablet Take 1 tablet (50 mg total) by mouth every 4 (four) hours as needed (Mild pain). 60 tablet 0   No current facility-administered medications for this visit.   Facility-Administered Medications Ordered in Other Visits  Medication Dose Route Frequency Provider Last Rate Last Dose  . heparin lock flush 100 unit/mL  500 Units Intravenous Once Forest Gleason, MD      . sodium chloride 0.9 % injection 10 mL  10 mL Intracatheter PRN Forest Gleason, MD        OBJECTIVE:  Filed Vitals:   05/27/15 1423  BP: 140/63  Pulse: 67  Temp: 97.3 F (36.3 C)  Resp: 18     Body mass index is 21.5 kg/(m^2).     ECOG FS:1 - Symptomatic but completely ambulatory  PHYSICAL EXAM: Gen. status: Patient is alert oriented not any acute distress slightly apprehensive.  Lymphatic system: No palpable supraclavicular cervical or axillary lymphadenopathy examination of breast: Right breast no palpable mass.  Left breast we have masses. Abdomen: Soft.  Liver and spleen not palpable.  Bowel sounds are present.  Lower extremity no swelling.  Neurological system: Heart functions within normal limit cranial nerves are intact no localizing sign skin: No ecchymosis no rash all other system has been reviewed and examined and they are within normal limit Cardiac exam revealed the PMI to be normally situated and sized. The rhythm was regular and no extrasystoles were noted during several minutes of auscultation. The first and second heart sounds were normal and physiologic splitting of the second heart sound was noted. There were no murmurs, rubs, clicks, or gallops. Examination of the chest was unremarkable. There were no bony deformities, no asymmetry, and no other abnormalities. Examination of the skin revealed no evidence of significant rashes, suspicious appearing nevi or other concerning lesions.  Lower extremity no edema   LAB RESULTS:  Infusion on 05/27/2015  Component Date Value Ref Range Status  . WBC 05/27/2015 4.3  3.6 - 11.0 K/uL Final  . RBC 05/27/2015 4.33  3.80 - 5.20 MIL/uL Final  . Hemoglobin 05/27/2015 11.9* 12.0 - 16.0 g/dL Final  . HCT 05/27/2015 36.7  35.0 - 47.0 % Final  . MCV 05/27/2015 84.8  80.0 - 100.0 fL Final  . MCH 05/27/2015 27.4  26.0 - 34.0 pg Final  . MCHC 05/27/2015 32.4  32.0 - 36.0 g/dL Final  . RDW 05/27/2015 17.3* 11.5 - 14.5 % Final  . Platelets 05/27/2015 183  150 - 440 K/uL Final  . Neutrophils Relative % 05/27/2015 67   Final  . Neutro Abs 05/27/2015 2.8  1.4 - 6.5 K/uL Final  . Lymphocytes Relative 05/27/2015 26   Final  . Lymphs Abs 05/27/2015 1.1  1.0 - 3.6 K/uL Final  .  Monocytes Relative 05/27/2015 6   Final  .  Monocytes Absolute 05/27/2015 0.3  0.2 - 0.9 K/uL Final  . Eosinophils Relative 05/27/2015 1   Final  . Eosinophils Absolute 05/27/2015 0.1  0 - 0.7 K/uL Final  . Basophils Relative 05/27/2015 0   Final  . Basophils Absolute 05/27/2015 0.0  0 - 0.1 K/uL Final  . Sodium 05/27/2015 131* 135 - 145 mmol/L Final  . Potassium 05/27/2015 3.7  3.5 - 5.1 mmol/L Final  . Chloride 05/27/2015 98* 101 - 111 mmol/L Final  . CO2 05/27/2015 26  22 - 32 mmol/L Final  . Glucose, Bld 05/27/2015 151* 65 - 99 mg/dL Final  . BUN 05/27/2015 14  6 - 20 mg/dL Final  . Creatinine, Ser 05/27/2015 0.71  0.44 - 1.00 mg/dL Final  . Calcium 05/27/2015 8.6* 8.9 - 10.3 mg/dL Final  . Total Protein 05/27/2015 6.7  6.5 - 8.1 g/dL Final  . Albumin 05/27/2015 3.8  3.5 - 5.0 g/dL Final  . AST 05/27/2015 30  15 - 41 U/L Final  . ALT 05/27/2015 27  14 - 54 U/L Final  . Alkaline Phosphatase 05/27/2015 60  38 - 126 U/L Final  . Total Bilirubin 05/27/2015 0.4  0.3 - 1.2 mg/dL Final  . GFR calc non Af Amer 05/27/2015 >60  >60 mL/min Final  . GFR calc Af Amer 05/27/2015 >60  >60 mL/min Final   Comment: (NOTE) The eGFR has been calculated using the CKD EPI equation. This calculation has not been validated in all clinical situations. eGFR's persistently <60 mL/min signify possible Chronic Kidney Disease.   . Anion gap 05/27/2015 7  5 - 15 Final    Lab Results  Component Value Date   LABCA2 13.3 04/23/2014   No results found for: CA199 Lab Results  Component Value Date   CEA 2.0 11/07/2014   No results found for: PSA No results found for: CA125   STUDIES: Ct Chest W Contrast  05/20/2015  RIGHT-SIDED BREAST CANCER OF STOMACH CANCER.  LIVER METASTASIS.  CHEMOTHERAPY ONGOING.: RIGHT-SIDED BREAST CANCER OF STOMACH CANCER. LIVER METASTASIS. CHEMOTHERAPY ONGOING. EXAM: CT CHEST, ABDOMEN, AND PELVIS WITH CONTRAST TECHNIQUE: Multidetector CT imaging of the chest, abdomen and pelvis  was performed following the standard protocol during bolus administration of intravenous contrast. CONTRAST:  26m OMNIPAQUE IOHEXOL 300 MG/ML  SOLN COMPARISON:  PET-CT 05/06/2014, CT 09/18/2014 FINDINGS: CT CHEST FINDINGS Mediastinum/Nodes: No axillary lymphadenopathy. Port in the LEFT chest wall. RIGHT mastectomy anatomy. No mediastinal hilar lymphadenopathy. No pericardial fluid. No central pulmonary embolism. Lungs/Pleura: 3 mm RIGHT upper lobe pulmonary nodule is unchanged. No new pulmonary nodules Musculoskeletal: No aggressive osseous lesion. CT ABDOMEN PELVIS FINDINGS Hepatobiliary: The hepatic lesions seen on comparison PET-CT scan are not readily identified. One low-attenuation region in the inferior RIGHT hepatic lobe measures 6 mm on image 51, series 2 similar to prior. No new lesions identified. Normal gallbladder. Pancreas: No pancreatic duct dilatation. Normal pancreatic parenchyma. Spleen: Normal spleen Adrenals/Urinary Tract: Adrenal gland, kidneys, ureters bladder normal. Stomach/Bowel: Stomach, small-bowel, appendix, cecum are normal. The colon and rectosigmoid colon are normal. Vascular/Lymphatic: Abdominal or is normal caliber. No periportal adenopathy. No gastrohepatic ligament adenopathy. No retroperitoneal adenopathy. No pelvic lymphadenopathy. Reproductive: Post hysterectomy. Other: Small peritoneal nodule along the ventral peritoneal surface adjacent to the transverse colon measures 5 mm on image 53, series 2. This is not seen on comparison exam Musculoskeletal: No aggressive osseous lesion. IMPRESSION: Chest Impression: No upset thoracic metastasis Abdomen / Pelvis Impression: 1. 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 2. Low-density lesion inferior RIGHT hepatic lobe is similar prior. Recommend attention on follow-up. No new lesions in the liver. Electronically Signed   By: Suzy Bouchard M.D.   On: 05/20/2015 16:48   Ct Abdomen Pelvis W Contrast  05/20/2015  RIGHT-SIDED BREAST CANCER OF STOMACH CANCER.  LIVER METASTASIS.  CHEMOTHERAPY ONGOING.: RIGHT-SIDED BREAST CANCER OF STOMACH CANCER. LIVER METASTASIS. CHEMOTHERAPY ONGOING. EXAM: CT CHEST, ABDOMEN, AND PELVIS WITH CONTRAST TECHNIQUE: Multidetector CT imaging of the chest, abdomen and pelvis was performed following the standard protocol during bolus administration of intravenous contrast. CONTRAST:  77m OMNIPAQUE IOHEXOL 300 MG/ML  SOLN COMPARISON:  PET-CT 05/06/2014, CT 09/18/2014 FINDINGS: CT CHEST FINDINGS Mediastinum/Nodes: No axillary lymphadenopathy. Port in the LEFT chest wall. RIGHT mastectomy anatomy. No mediastinal hilar lymphadenopathy. No pericardial fluid. No central pulmonary embolism. Lungs/Pleura: 3 mm RIGHT upper lobe pulmonary nodule is unchanged. No new pulmonary nodules Musculoskeletal: No aggressive osseous lesion. CT ABDOMEN PELVIS FINDINGS Hepatobiliary: The hepatic lesions seen on comparison PET-CT scan are not readily identified. One low-attenuation region in the inferior RIGHT hepatic lobe measures 6 mm on image 51, series 2 similar to prior. No new lesions identified. Normal gallbladder. Pancreas: No pancreatic duct dilatation. Normal pancreatic parenchyma. Spleen: Normal spleen Adrenals/Urinary Tract: Adrenal gland, kidneys, ureters bladder normal. Stomach/Bowel: Stomach, small-bowel, appendix, cecum are normal. The colon and rectosigmoid colon are normal. Vascular/Lymphatic: Abdominal or is normal caliber. No periportal adenopathy. No gastrohepatic ligament adenopathy. No retroperitoneal adenopathy. No pelvic lymphadenopathy. Reproductive: Post hysterectomy. Other: Small peritoneal nodule along the ventral peritoneal surface adjacent to the transverse colon measures 5 mm on image 53, series 2. This is not seen on comparison exam Musculoskeletal: No aggressive osseous lesion. IMPRESSION: Chest Impression:  No upset thoracic metastasis Abdomen / Pelvis Impression: 1. 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 2. Low-density lesion inferior RIGHT hepatic lobe is similar prior. Recommend attention on follow-up. No new lesions in the liver. Electronically Signed   By: SSuzy BouchardM.D.   On: 05/20/2015 16:48    ASSESSMENT: 1.  Carcinoma of breast (right) from clinical examination as well as reviewing CT scan it appears that patient has excellent response.  Will continue maintenance Herceptin therapy Possibility of local therapy is going to be discussed 2.  Carcinoma of stomach.  Pathology report has been reviewed.  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 If we get pushed back from the insurance company then CT scan of chest abdomen and pelvis with contrast can be done and it there is any abnormality detected a PET scan can be ordered Reevaluate patient in 3 weeks for continuing Herceptin therapy and evaluating this scan report CT scan has been reviewed independently Single peritentorial nodule no other evidence of recurrent or progressive disease Continue Herceptin Sinus drainage most likely allergic in nature no need for any antibiotics  Breast cancer   Staging form: Breast, AJCC 7th Edition     Clinical: Stage IV (T2, N1, M1) - Signed by JForest Gleason MD on 10/19/2014 Cancer of body  of stomach   Staging form: Stomach, AJCC 7th Edition     Clinical: Stage IIA (T3, N0, M0) - Marni Griffon, MD   06/03/2015 7:37 AM

## 2015-06-17 ENCOUNTER — Inpatient Hospital Stay: Payer: BLUE CROSS/BLUE SHIELD

## 2015-06-17 ENCOUNTER — Encounter: Payer: Self-pay | Admitting: Internal Medicine

## 2015-06-17 ENCOUNTER — Inpatient Hospital Stay: Payer: BLUE CROSS/BLUE SHIELD | Attending: Family Medicine | Admitting: Family Medicine

## 2015-06-17 VITALS — BP 115/84 | HR 67 | Temp 97.3°F | Wt 103.0 lb

## 2015-06-17 DIAGNOSIS — C162 Malignant neoplasm of body of stomach: Secondary | ICD-10-CM

## 2015-06-17 DIAGNOSIS — C778 Secondary and unspecified malignant neoplasm of lymph nodes of multiple regions: Secondary | ICD-10-CM | POA: Diagnosis not present

## 2015-06-17 DIAGNOSIS — K219 Gastro-esophageal reflux disease without esophagitis: Secondary | ICD-10-CM | POA: Diagnosis not present

## 2015-06-17 DIAGNOSIS — F419 Anxiety disorder, unspecified: Secondary | ICD-10-CM | POA: Diagnosis not present

## 2015-06-17 DIAGNOSIS — C50411 Malignant neoplasm of upper-outer quadrant of right female breast: Secondary | ICD-10-CM | POA: Diagnosis not present

## 2015-06-17 DIAGNOSIS — Z79899 Other long term (current) drug therapy: Secondary | ICD-10-CM | POA: Insufficient documentation

## 2015-06-17 DIAGNOSIS — M1990 Unspecified osteoarthritis, unspecified site: Secondary | ICD-10-CM | POA: Diagnosis not present

## 2015-06-17 DIAGNOSIS — Z5112 Encounter for antineoplastic immunotherapy: Secondary | ICD-10-CM | POA: Insufficient documentation

## 2015-06-17 DIAGNOSIS — Z9011 Acquired absence of right breast and nipple: Secondary | ICD-10-CM | POA: Diagnosis not present

## 2015-06-17 DIAGNOSIS — Z90710 Acquired absence of both cervix and uterus: Secondary | ICD-10-CM | POA: Diagnosis not present

## 2015-06-17 DIAGNOSIS — Z87891 Personal history of nicotine dependence: Secondary | ICD-10-CM | POA: Diagnosis not present

## 2015-06-17 DIAGNOSIS — Z171 Estrogen receptor negative status [ER-]: Secondary | ICD-10-CM | POA: Insufficient documentation

## 2015-06-17 DIAGNOSIS — C50911 Malignant neoplasm of unspecified site of right female breast: Secondary | ICD-10-CM

## 2015-06-17 DIAGNOSIS — C169 Malignant neoplasm of stomach, unspecified: Secondary | ICD-10-CM | POA: Diagnosis not present

## 2015-06-17 DIAGNOSIS — C787 Secondary malignant neoplasm of liver and intrahepatic bile duct: Secondary | ICD-10-CM | POA: Insufficient documentation

## 2015-06-17 DIAGNOSIS — C50922 Malignant neoplasm of unspecified site of left male breast: Secondary | ICD-10-CM

## 2015-06-17 LAB — COMPREHENSIVE METABOLIC PANEL
ALBUMIN: 3.9 g/dL (ref 3.5–5.0)
ALK PHOS: 56 U/L (ref 38–126)
ALT: 12 U/L — AB (ref 14–54)
ANION GAP: 4 — AB (ref 5–15)
AST: 13 U/L — ABNORMAL LOW (ref 15–41)
BUN: 21 mg/dL — ABNORMAL HIGH (ref 6–20)
CALCIUM: 8.6 mg/dL — AB (ref 8.9–10.3)
CO2: 28 mmol/L (ref 22–32)
Chloride: 101 mmol/L (ref 101–111)
Creatinine, Ser: 0.7 mg/dL (ref 0.44–1.00)
GFR calc Af Amer: >60 mL/min (ref >60)
GFR calc non Af Amer: >60 mL/min (ref >60)
GLUCOSE: 135 mg/dL — AB (ref 65–99)
Potassium: 3.6 mmol/L (ref 3.5–5.1)
SODIUM: 133 mmol/L — AB (ref 135–145)
Total Bilirubin: 0.4 mg/dL (ref 0.3–1.2)
Total Protein: 6.9 g/dL (ref 6.5–8.1)

## 2015-06-17 LAB — CBC WITH DIFFERENTIAL/PLATELET
Basophils Absolute: 0 10*3/uL (ref 0–0.1)
Basophils Relative: 0 %
Eosinophils Absolute: 0 10*3/uL (ref 0–0.7)
Eosinophils Relative: 1 %
HEMATOCRIT: 38.1 % (ref 35.0–47.0)
HEMOGLOBIN: 12.3 g/dL (ref 12.0–16.0)
LYMPHS ABS: 1 10*3/uL (ref 1.0–3.6)
LYMPHS PCT: 26 %
MCH: 27.3 pg (ref 26.0–34.0)
MCHC: 32.3 g/dL (ref 32.0–36.0)
MCV: 84.6 fL (ref 80.0–100.0)
MONOS PCT: 6 %
Monocytes Absolute: 0.2 10*3/uL (ref 0.2–0.9)
NEUTROS ABS: 2.4 10*3/uL (ref 1.4–6.5)
NEUTROS PCT: 67 %
Platelets: 150 10*3/uL (ref 150–440)
RBC: 4.51 MIL/uL (ref 3.80–5.20)
RDW: 16.1 % — ABNORMAL HIGH (ref 11.5–14.5)
WBC: 3.6 10*3/uL (ref 3.6–11.0)

## 2015-06-17 MED ORDER — SODIUM CHLORIDE 0.9 % IV SOLN
Freq: Once | INTRAVENOUS | Status: AC
  Administered 2015-06-17: 15:00:00 via INTRAVENOUS
  Filled 2015-06-17: qty 1000

## 2015-06-17 MED ORDER — ACETAMINOPHEN 325 MG PO TABS
650.0000 mg | ORAL_TABLET | Freq: Once | ORAL | Status: AC
  Administered 2015-06-17: 650 mg via ORAL
  Filled 2015-06-17: qty 2

## 2015-06-17 MED ORDER — TRASTUZUMAB CHEMO INJECTION 440 MG
273.0000 mg | Freq: Once | INTRAVENOUS | Status: AC
  Administered 2015-06-17: 273 mg via INTRAVENOUS
  Filled 2015-06-17: qty 13

## 2015-06-17 MED ORDER — SODIUM CHLORIDE 0.9 % IJ SOLN
10.0000 mL | INTRAMUSCULAR | Status: DC | PRN
  Administered 2015-06-17: 10 mL via INTRAVENOUS
  Filled 2015-06-17: qty 10

## 2015-06-17 MED ORDER — HEPARIN SOD (PORK) LOCK FLUSH 100 UNIT/ML IV SOLN
500.0000 [IU] | Freq: Once | INTRAVENOUS | Status: AC
  Administered 2015-06-17: 500 [IU] via INTRAVENOUS

## 2015-06-17 MED ORDER — HEPARIN SOD (PORK) LOCK FLUSH 100 UNIT/ML IV SOLN
500.0000 [IU] | Freq: Once | INTRAVENOUS | Status: DC | PRN
  Filled 2015-06-17: qty 5

## 2015-06-17 MED ORDER — DIPHENHYDRAMINE HCL 25 MG PO CAPS: 50.0000 mg | ORAL_CAPSULE | Freq: Once | ORAL | Status: DC

## 2015-06-17 NOTE — Progress Notes (Signed)
Patient has new insurance company.  Presented new card to registration.  Patient states when she stands up for any length of time she has pain in her right side - same side as mastectomy.

## 2015-06-17 NOTE — Progress Notes (Signed)
[place] @ [facility] [phone]) [phone]  [phone]     Kimberly Thornton OB: 08/12/1950  MR#: [id number]  JEH#:[id number]  Patient Care Team: Sofie Hartigan, MD as PCP - General (Family Medicine) Dia Crawford III, MD (Surgery) Clent Jacks, RN as Registered Nurse  CHIEF COMPLAINT:  Chief Complaint  Patient presents with  . Breast Cancer     1. Carcinoma of breast right upper quadrant  (4 cm tumor mass) 3 positive lymph node on needle biopsy estrogen receptor progesterone receptor negative.  HER-2/neu receptor positiveby IHC 3+ Diagnosis November, 2015, T2 N1 M0 tumor stage II disease 2. Muga SCAN OF THE HEART EJECTION FRACTION 66% 3. Abnormal PET scan suggesting a mass in the stomach, and liver metastases 4. Upper endoscopy given fungating mass biopsies consistent with stomach primary cancer, HER-2/neu by IHC 2+ and by fish is 1.86. 5. Liver biopsies negative for any malignancy. 6. BRCA 2 sequencing deleterious mutation in 2014 ins As mentioned above. 7. ENDOSCOPY ULTRASOUND SHOWS STOMACH TUMOR TO BE t3 n0 m0 8. 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 Herceptin therapy 9. 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 August, 2016 Mastectomy right breast pT1 pNxMx, estrogen receptor  Negative.  Progesterone receptor negative.  HER-2/neu receptor positive tumor 12. She is on maintenance Herceptin therapy since August of 2016, MUGA scan in November of 2016 stable 69%.   Oncology Flowsheet 02/05/2015 02/11/2015 03/04/2015 03/25/2015 04/15/2015 05/06/2015 05/27/2015  ALPRAZolam (XANAX) PO 0.25 mg - - - - - -  cyanocobalamin ((VITAMIN B-12)) IM - - 1,000 mcg - 1,000 mcg - 1,000 mcg  dexamethasone (DECADRON) IJ - - - - - - -  diphenhydrAMINE (BENADRYL) PO - 25 mg 25 mg 25 mg 50 mg 50 mg -  enoxaparin (LOVENOX) [place]   - - - - - -  LORazepam (ATIVAN) IV - - - - - - -  LORazepam (ATIVAN) PO -  - - - - - -  ondansetron (ZOFRAN) IV - - - - - - -  ondansetron (ZOFRAN) PO - - - - - - -  promethazine (PHENERGAN) IV - - - - - - -  trastuzumab (HERCEPTIN) IV - 6 mg/kg 273 mg 273 mg 273 mg 273 mg 273 mg    INTERVAL HISTORY:  Patient is here for continued follow-up and treatment consideration regarding carcinoma of breast as well as carcinoma of stomach. Appetite is slowly improving. Energy level has improved somewhat. Last MUGA scan was in November reported to be stable. She overall reports feeling fairly well. She continues to work full-time.  REVIEW OF SYSTEMS:   GENERAL:  Feels good.  Active.  No fevers, sweats or weight loss. PERFORMANCE STATUS (ECOG): 01 HEENT:  No visual changes, runny nose, sore throat, mouth sores or tenderness. Lungs: No shortness of breath or cough.  No hemoptysis. Cardiac:  No chest pain, palpitations, orthopnea, or PND. GI:  No nausea, vomiting, diarrhea, constipation, melena or hematochezia. GU:  No urgency, frequency, dysuria, or hematuria. Musculoskeletal:  No back pain.  No joint pain.  No muscle tenderness. Extremities:  No pain or swelling. Skin:  No rashes or skin changes. Neuro:  No headache, numbness or weakness, balance or coordination issues. Endocrine:  No diabetes, thyroid issues, hot flashes or night sweats. Psych:  No mood changes, depression or anxiety. Pain:  No focal pain. Review of systems:  All other systems reviewed and found to be negative.  PAST MEDICAL HISTORY: Past Medical History  Diagnosis Date  . Right breast cancer with malignant cells in regional lymph nodes no greater than 0.2 mm and no more than 200 cells ([place])   . Arthritis   . GERD (gastroesophageal reflux disease)   . Stomach cancer ([place])   . Family history of adverse reaction to anesthesia     mother had nausea  . Glaucoma   . Anxiety     PAST SURGICAL HISTORY: Past Surgical History  Procedure Laterality Date  . Abdominal hysterectomy    . Portacath  placement  05/01/2014    Dr. Pat Patrick  . Gastrectomy N/A 11/11/2014    Procedure: Scarlett Presto I, GASTRODUODENOSTOMY, IOUS OF LIVER ;  Surgeon: Molly Maduro, MD;  Location: [facility] ORS;  Service: General;  Laterality: N/A;  . Simple mastectomy with axillary sentinel node biopsy Right 02/04/2015    Procedure: SIMPLE MASTECTOMY;  Surgeon: Dia Crawford III, MD;  Location: [facility] ORS;  Service: General;  Laterality: Right;    FAMILY HISTORY  Significant History/PMH:   Arthritis:    Glaucoma:    Breast Cancer:    GERD - Esophageal Reflux:    Colonoscopy:    Hysterectomy:   Preventive Screening:  Has patient had any of the following test? Colonscopy  Mammography  Pap Smear (1)   Last Colonoscopy: 2010(1)   Last Mammography: 2015(1)   Last Pap Smear: 2012-hysterectomy in 1970's(1)   Smoking History: Smoking History Cigarettes per day and smoked for 25 years less than a half pack per day; quit 17 years ago(1)  PFSH: Comments: mother had a breast cancer when she was 39 years old.mother sister had breast cancer.  Mother also has stomach cancer  Social History: negative alcohol, negative tobacco  Additional Past Medical and Surgical History: gastroesophageal reflux disease  Arthritis  Hysterectomy.  Ovaries were not removed       ADVANCED DIRECTIVES: Patient does not have any advanced healthcare directive. Information has been given.   HEALTH MAINTENANCE: Social History  Substance Use Topics  . Smoking status: Former Smoker    Quit date: 07/09/1996  . Smokeless tobacco: Never Used  . Alcohol Use: 1.8 - 2.4 oz/week    3-4 Standard drinks or equivalent per week      Allergies  Allergen Reactions  . Codeine Other (See Comments) and Nausea Only    GI distress    Current Outpatient Prescriptions  Medication Sig Dispense Refill  . acetaminophen (TYLENOL) 500 MG tablet Take 500 mg by mouth every 6 (six) hours as needed for moderate pain or headache.    . ALPRAZolam  (XANAX) 0.25 MG tablet Take 1 tablet (0.25 mg total) by mouth 2 (two) times daily at 10 am and 4 pm. 60 tablet 3  . brimonidine-timolol (COMBIGAN) 0.2-0.5 % ophthalmic solution Place 1 drop into the left eye 2 (two) times daily.     . citalopram (CELEXA) 20 MG tablet Take 1 tablet (20 mg total) by mouth daily. 90 tablet 3  . LORazepam (ATIVAN) 0.5 MG tablet Take 1 tablet (0.5 mg total) by mouth at bedtime as needed for anxiety. 30 tablet 3  . metoCLOPramide (REGLAN) 10 MG tablet Take 1 tablet (10 mg total) by mouth 2 (two) times daily. 60 tablet 3  . Multiple Vitamins-Minerals (MULTIVITAMIN WITH MINERALS) tablet Take 1 tablet by mouth daily.    . multivitamin (GERI-TONIC) LIQD Take 15 mLs by mouth daily.    Marland Kitchen omeprazole (PRILOSEC) 40 MG capsule Take 40 mg by  mouth daily.   2  . ondansetron (ZOFRAN-ODT) 4 MG disintegrating tablet Take 1 tablet (4 mg total) by mouth every 4 (four) hours as needed for nausea. 30 tablet 3  . ranitidine (ZANTAC) 150 MG capsule     . traMADol (ULTRAM) 50 MG tablet Take 1 tablet (50 mg total) by mouth every 4 (four) hours as needed (Mild pain). 60 tablet 0   No current facility-administered medications for this visit.   Facility-Administered Medications Ordered in Other Visits  Medication Dose Route Frequency Provider Last Rate Last Dose  . heparin lock flush 100 unit/mL  500 Units Intravenous Once Forest Gleason, MD      . heparin lock flush 100 unit/mL  500 Units Intravenous Once Forest Gleason, MD      . sodium chloride 0.9 % injection 10 mL  10 mL Intracatheter PRN Forest Gleason, MD      . sodium chloride 0.9 % injection 10 mL  10 mL Intravenous PRN Forest Gleason, MD   10 mL at 06/17/15 1343    OBJECTIVE:  Filed Vitals:   06/17/15 1410  BP: 115/84  Pulse: 67  Temp: 97.3 F (36.3 C)     Body mass index is 20.79 kg/(m^2).    ECOG FS:1 - Symptomatic but completely ambulatory  PHYSICAL EXAM: Gen. status: Patient is alert oriented not any acute distress slightly  apprehensive.   Lymphatic system: No palpable supraclavicular cervical or axillary lymphadenopathy  Abdomen: Soft.  Liver and spleen not palpable.  Bowel sounds are present.   Lower extremity no swelling.  Neurological system: Heart functions within normal limit cranial nerves are intact no localizing sign  Skin: No ecchymosis no rash all other system has been reviewed and examined and they are within normal limit Cardiac: The rhythm was regular, there were no murmurs, rubs, clicks, or gallops. Lungs: Clear to auscultation bilaterally.  LAB RESULTS:  Infusion on 06/17/2015  Component Date Value Ref Range Status  . WBC 06/17/2015 3.6  3.6 - 11.0 K/uL Final  . RBC 06/17/2015 4.51  3.80 - 5.20 MIL/uL Final  . Hemoglobin 06/17/2015 12.3  12.0 - 16.0 g/dL Final  . HCT 06/17/2015 38.1  35.0 - 47.0 % Final  . MCV 06/17/2015 84.6  80.0 - 100.0 fL Final  . MCH 06/17/2015 27.3  26.0 - 34.0 pg Final  . MCHC 06/17/2015 32.3  32.0 - 36.0 g/dL Final  . RDW 06/17/2015 16.1* 11.5 - 14.5 % Final  . Platelets 06/17/2015 150  150 - 440 K/uL Final  . Neutrophils Relative % 06/17/2015 67   Final  . Neutro Abs 06/17/2015 2.4  1.4 - 6.5 K/uL Final  . Lymphocytes Relative 06/17/2015 26   Final  . Lymphs Abs 06/17/2015 1.0  1.0 - 3.6 K/uL Final  . Monocytes Relative 06/17/2015 6   Final  . Monocytes Absolute 06/17/2015 0.2  0.2 - 0.9 K/uL Final  . Eosinophils Relative 06/17/2015 1   Final  . Eosinophils Absolute 06/17/2015 0.0  0 - 0.7 K/uL Final  . Basophils Relative 06/17/2015 0   Final  . Basophils Absolute 06/17/2015 0.0  0 - 0.1 K/uL Final  . Sodium 06/17/2015 133* 135 - 145 mmol/L Final  . Potassium 06/17/2015 3.6  3.5 - 5.1 mmol/L Final  . Chloride 06/17/2015 101  101 - 111 mmol/L Final  . CO2 06/17/2015 28  22 - 32 mmol/L Final  . Glucose, Bld 06/17/2015 135* 65 - 99 mg/dL Final  . BUN 06/17/2015 21* 6 -  20 mg/dL Final  . Creatinine, Ser 06/17/2015 0.70  0.44 - 1.00 mg/dL Final  . Calcium  06/17/2015 8.6* 8.9 - 10.3 mg/dL Final  . Total Protein 06/17/2015 6.9  6.5 - 8.1 g/dL Final  . Albumin 06/17/2015 3.9  3.5 - 5.0 g/dL Final  . AST 06/17/2015 13* 15 - 41 U/L Final  . ALT 06/17/2015 12* 14 - 54 U/L Final  . Alkaline Phosphatase 06/17/2015 56  38 - 126 U/L Final  . Total Bilirubin 06/17/2015 0.4  0.3 - 1.2 mg/dL Final  . GFR calc non Af Amer 06/17/2015 >60  >60 mL/min Final  . GFR calc Af Amer 06/17/2015 >60  >60 mL/min Final   Comment: (NOTE) The eGFR has been calculated using the CKD EPI equation. This calculation has not been validated in all clinical situations. eGFR's persistently <60 mL/min signify possible Chronic Kidney Disease.   . Anion gap 06/17/2015 4* 5 - 15 Final    Lab Results  Component Value Date   LABCA2 13.3 04/23/2014   No results found for: CA199 Lab Results  Component Value Date   CEA 2.0 11/07/2014   No results found for: PSA No results found for: CA125   STUDIES: Ct Chest W Contrast  05/20/2015  RIGHT-SIDED BREAST CANCER OF STOMACH CANCER.  LIVER METASTASIS.  CHEMOTHERAPY ONGOING.: RIGHT-SIDED BREAST CANCER OF STOMACH CANCER. LIVER METASTASIS. CHEMOTHERAPY ONGOING. EXAM: CT CHEST, ABDOMEN, AND PELVIS WITH CONTRAST TECHNIQUE: Multidetector CT imaging of the chest, abdomen and pelvis was performed following the standard protocol during bolus administration of intravenous contrast. CONTRAST:  7m OMNIPAQUE IOHEXOL 300 MG/ML  SOLN COMPARISON:  PET-CT 05/06/2014, CT 09/18/2014 FINDINGS: CT CHEST FINDINGS Mediastinum/Nodes: No axillary lymphadenopathy. Port in the LEFT chest wall. RIGHT mastectomy anatomy. No mediastinal hilar lymphadenopathy. No pericardial fluid. No central pulmonary embolism. Lungs/Pleura: 3 mm RIGHT upper lobe pulmonary nodule is unchanged. No new pulmonary nodules Musculoskeletal: No aggressive osseous lesion. CT ABDOMEN PELVIS FINDINGS Hepatobiliary: The hepatic lesions seen on comparison PET-CT scan are not readily  identified. One low-attenuation region in the inferior RIGHT hepatic lobe measures 6 mm on image 51, series 2 similar to prior. No new lesions identified. Normal gallbladder. Pancreas: No pancreatic duct dilatation. Normal pancreatic parenchyma. Spleen: Normal spleen Adrenals/Urinary Tract: Adrenal gland, kidneys, ureters bladder normal. Stomach/Bowel: Stomach, small-bowel, appendix, cecum are normal. The colon and rectosigmoid colon are normal. Vascular/Lymphatic: Abdominal or is normal caliber. No periportal adenopathy. No gastrohepatic ligament adenopathy. No retroperitoneal adenopathy. No pelvic lymphadenopathy. Reproductive: Post hysterectomy. Other: Small peritoneal nodule along the ventral peritoneal surface adjacent to the transverse colon measures 5 mm on image 53, series 2. This is not seen on comparison exam Musculoskeletal: No aggressive osseous lesion. IMPRESSION: Chest Impression: No upset thoracic metastasis Abdomen / Pelvis Impression: 1. 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 2. Low-density lesion inferior RIGHT hepatic lobe is similar prior. Recommend attention on follow-up. No new lesions in the liver. Electronically Signed   By: SSuzy BouchardM.D.   On: 05/20/2015 16:48   Ct Abdomen Pelvis W Contrast  05/20/2015  RIGHT-SIDED BREAST CANCER OF STOMACH CANCER.  LIVER METASTASIS.  CHEMOTHERAPY ONGOING.: RIGHT-SIDED BREAST CANCER OF STOMACH CANCER. LIVER METASTASIS. CHEMOTHERAPY ONGOING. EXAM: CT CHEST, ABDOMEN, AND PELVIS WITH CONTRAST TECHNIQUE: Multidetector CT imaging of the chest, abdomen and pelvis was performed following the standard protocol during bolus administration of intravenous contrast. CONTRAST:  823mOMNIPAQUE IOHEXOL 300 MG/ML  SOLN COMPARISON:  PET-CT 05/06/2014, CT 09/18/2014 FINDINGS: CT CHEST FINDINGS Mediastinum/Nodes: No axillary lymphadenopathy. Port in the LEFT  chest wall. RIGHT mastectomy anatomy. No mediastinal hilar lymphadenopathy. No pericardial fluid. No central pulmonary embolism. Lungs/Pleura: 3 mm RIGHT upper lobe pulmonary nodule is unchanged. No new pulmonary nodules Musculoskeletal: No aggressive osseous lesion. CT ABDOMEN PELVIS FINDINGS Hepatobiliary: The hepatic lesions seen on comparison PET-CT scan are not readily identified. One low-attenuation region in the inferior RIGHT hepatic lobe measures 6 mm on image 51, series 2 similar to prior. No new lesions identified. Normal gallbladder. Pancreas: No pancreatic duct dilatation. Normal pancreatic parenchyma. Spleen: Normal spleen Adrenals/Urinary Tract: Adrenal gland, kidneys, ureters bladder normal. Stomach/Bowel: Stomach, small-bowel, appendix, cecum are normal. The colon and rectosigmoid colon are normal. Vascular/Lymphatic: Abdominal or is normal caliber. No periportal adenopathy. No gastrohepatic ligament adenopathy. No retroperitoneal adenopathy. No pelvic lymphadenopathy. Reproductive: Post hysterectomy. Other: Small peritoneal nodule along the ventral peritoneal surface adjacent to the transverse colon measures 5 mm on image 53, series 2. This is not seen on comparison exam Musculoskeletal: No aggressive osseous lesion. IMPRESSION: Chest Impression: No upset thoracic metastasis Abdomen / Pelvis Impression: 1. 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 2. Low-density lesion inferior RIGHT hepatic lobe is similar prior. Recommend attention on follow-up. No new lesions in the liver. Electronically Signed   By: Suzy Bouchard M.D.   On: 05/20/2015 16:48    ASSESSMENT: 1.  Carcinoma of breast (right)/ Carcinoma of stomach. Patient recently in December 2016, had a CT scan for reevaluation of disease. Noted to have a single peritoneal nodule, and no other evidence of recurrent or progressive  disease. Will continue maintenance Herceptin therapy. Patient's last MUGA scan was performed in November 2016. We'll have patient follow-up for her next Herceptin in 3 weeks. We'll schedule MUGA scan in 5 weeks. Patient will then follow-up with Dr. Oliva Bustard again in 6 weeks for continuation of Herceptin.    Breast cancer   Staging form: Breast, AJCC 7th Edition     Clinical: Stage IV (T2, N1, M1) - Signed by Forest Gleason, MD on 10/19/2014 Cancer of body of stomach   Staging form: Stomach, AJCC 7th Edition     Clinical: Stage IIA (T3, N0, M0) - Unsigned   Evlyn Kanner, NP   06/17/2015 3:00 PM

## 2015-06-24 ENCOUNTER — Telehealth: Payer: Self-pay | Admitting: *Deleted

## 2015-06-24 DIAGNOSIS — C162 Malignant neoplasm of body of stomach: Secondary | ICD-10-CM

## 2015-06-24 MED ORDER — ALPRAZOLAM 0.25 MG PO TABS: 0.2500 mg | ORAL_TABLET | Freq: Two times a day (BID) | ORAL | Status: DC

## 2015-06-24 NOTE — Telephone Encounter (Signed)
faxed

## 2015-07-03 ENCOUNTER — Telehealth: Payer: Self-pay | Admitting: *Deleted

## 2015-07-03 NOTE — Telephone Encounter (Signed)
Per Dr Oliva Bustard, will order brain scan at her next appt and discuss with  Her at that time. I have attempted to call Ms Kimberly Thornton back adn she has no VM set up

## 2015-07-03 NOTE — Telephone Encounter (Signed)
I spoke with Kimberly Thornton and explained that we will eval at her visit nexty week and order scan at that time. She thanked me for calling her back

## 2015-07-03 NOTE — Telephone Encounter (Signed)
Called to report that her mother is having mental changes, not as sharp as usual, difficulty focusing, not remember passwords to get on comnputer at work and has had to cut back her work hours because of this. Asking what could be causing it and that we call her and not her mother with answer

## 2015-07-07 ENCOUNTER — Emergency Department: Payer: BLUE CROSS/BLUE SHIELD

## 2015-07-07 ENCOUNTER — Emergency Department
Admission: EM | Admit: 2015-07-07 | Discharge: 2015-07-08 | Disposition: A | Discharge status: Other Acute Inpatient Hospital | Payer: BLUE CROSS/BLUE SHIELD | Attending: Emergency Medicine | Admitting: Emergency Medicine

## 2015-07-07 ENCOUNTER — Encounter: Payer: Self-pay | Admitting: Emergency Medicine

## 2015-07-07 DIAGNOSIS — Y9289 Other specified places as the place of occurrence of the external cause: Secondary | ICD-10-CM | POA: Diagnosis not present

## 2015-07-07 DIAGNOSIS — W07XXXA Fall from chair, initial encounter: Secondary | ICD-10-CM | POA: Diagnosis not present

## 2015-07-07 DIAGNOSIS — R413 Other amnesia: Secondary | ICD-10-CM | POA: Diagnosis not present

## 2015-07-07 DIAGNOSIS — G9389 Other specified disorders of brain: Secondary | ICD-10-CM

## 2015-07-07 DIAGNOSIS — Y998 Other external cause status: Secondary | ICD-10-CM | POA: Diagnosis not present

## 2015-07-07 DIAGNOSIS — R55 Syncope and collapse: Secondary | ICD-10-CM | POA: Insufficient documentation

## 2015-07-07 DIAGNOSIS — S4992XA Unspecified injury of left shoulder and upper arm, initial encounter: Secondary | ICD-10-CM | POA: Diagnosis not present

## 2015-07-07 DIAGNOSIS — Y9389 Activity, other specified: Secondary | ICD-10-CM | POA: Diagnosis not present

## 2015-07-07 DIAGNOSIS — R569 Unspecified convulsions: Secondary | ICD-10-CM

## 2015-07-07 DIAGNOSIS — Z79899 Other long term (current) drug therapy: Secondary | ICD-10-CM | POA: Insufficient documentation

## 2015-07-07 DIAGNOSIS — G936 Cerebral edema: Secondary | ICD-10-CM

## 2015-07-07 DIAGNOSIS — M79602 Pain in left arm: Secondary | ICD-10-CM

## 2015-07-07 DIAGNOSIS — R4182 Altered mental status, unspecified: Secondary | ICD-10-CM | POA: Diagnosis not present

## 2015-07-07 DIAGNOSIS — Z87891 Personal history of nicotine dependence: Secondary | ICD-10-CM | POA: Diagnosis not present

## 2015-07-07 LAB — URINALYSIS COMPLETE WITH MICROSCOPIC (ARMC ONLY)
Bacteria, UA: NONE SEEN
Bilirubin Urine: NEGATIVE
GLUCOSE, UA: NEGATIVE mg/dL
Hgb urine dipstick: NEGATIVE
KETONES UR: NEGATIVE mg/dL
NITRITE: NEGATIVE
Protein, ur: NEGATIVE mg/dL
SPECIFIC GRAVITY, URINE: 1.024 (ref 1.005–1.030)
Squamous Epithelial / LPF: NONE SEEN
pH: 5 (ref 5.0–8.0)

## 2015-07-07 LAB — URINE DRUG SCREEN, QUALITATIVE (ARMC ONLY)
AMPHETAMINES, UR SCREEN: NOT DETECTED
Barbiturates, Ur Screen: NOT DETECTED
Benzodiazepine, Ur Scrn: NOT DETECTED
COCAINE METABOLITE, UR ~~LOC~~: NOT DETECTED
Cannabinoid 50 Ng, Ur ~~LOC~~: NOT DETECTED
MDMA (ECSTASY) UR SCREEN: NOT DETECTED
Methadone Scn, Ur: NOT DETECTED
Opiate, Ur Screen: NOT DETECTED
PHENCYCLIDINE (PCP) UR S: NOT DETECTED
Tricyclic, Ur Screen: NOT DETECTED

## 2015-07-07 LAB — CBC
HEMATOCRIT: 19 % — AB (ref 35.0–47.0)
Hemoglobin: 6.1 g/dL — ABNORMAL LOW (ref 12.0–16.0)
MCH: 27.7 pg (ref 26.0–34.0)
MCHC: 32.2 g/dL (ref 32.0–36.0)
MCV: 86 fL (ref 80.0–100.0)
Platelets: 75 10*3/uL — ABNORMAL LOW (ref 150–440)
RBC: 2.21 MIL/uL — AB (ref 3.80–5.20)
RDW: 14.7 % — ABNORMAL HIGH (ref 11.5–14.5)
WBC: 2.8 10*3/uL — AB (ref 3.6–11.0)

## 2015-07-07 LAB — TROPONIN I: Troponin I: 0.09 ng/mL — ABNORMAL HIGH (ref <0.031)

## 2015-07-07 LAB — COMPREHENSIVE METABOLIC PANEL
ALBUMIN: 3.4 g/dL — AB (ref 3.5–5.0)
ALK PHOS: 48 U/L (ref 38–126)
ALT: 9 U/L — AB (ref 14–54)
AST: 17 U/L (ref 15–41)
Anion gap: 5 (ref 5–15)
BILIRUBIN TOTAL: 0.1 mg/dL — AB (ref 0.3–1.2)
BUN: 19 mg/dL (ref 6–20)
CALCIUM: 7.9 mg/dL — AB (ref 8.9–10.3)
CO2: 23 mmol/L (ref 22–32)
CREATININE: 0.85 mg/dL (ref 0.44–1.00)
Chloride: 107 mmol/L (ref 101–111)
GFR calc Af Amer: >60 mL/min (ref >60)
GFR calc non Af Amer: >60 mL/min (ref >60)
GLUCOSE: 113 mg/dL — AB (ref 65–99)
Potassium: 3.5 mmol/L (ref 3.5–5.1)
SODIUM: 135 mmol/L (ref 135–145)
TOTAL PROTEIN: 6.2 g/dL — AB (ref 6.5–8.1)

## 2015-07-07 LAB — AMMONIA: Ammonia: 23 umol/L (ref 9–35)

## 2015-07-07 LAB — GLUCOSE, CAPILLARY: Glucose-Capillary: 105 mg/dL — ABNORMAL HIGH (ref 65–99)

## 2015-07-07 LAB — ETHANOL: Alcohol, Ethyl (B): <5 mg/dL (ref <5)

## 2015-07-07 MED ORDER — DEXAMETHASONE SODIUM PHOSPHATE 10 MG/ML IJ SOLN
10.0000 mg | Freq: Once | INTRAMUSCULAR | Status: AC
  Administered 2015-07-07: 10 mg via INTRAVENOUS
  Filled 2015-07-07: qty 1

## 2015-07-07 MED ORDER — SODIUM CHLORIDE 0.9 % IV BOLUS (SEPSIS)
1000.0000 mL | Freq: Once | INTRAVENOUS | Status: AC
  Administered 2015-07-07: 1000 mL via INTRAVENOUS

## 2015-07-07 NOTE — ED Notes (Signed)
MD at bedside. 

## 2015-07-07 NOTE — ED Provider Notes (Signed)
[facility] Emergency Department Provider Note  ____________________________________________  Time seen: Approximately 8:01 PM  I have reviewed the triage vital signs and the nursing notes.   HISTORY  Chief Complaint Near Syncope and Altered Mental Status  History and physical are limited due to patient altered mental status.  HPI Kimberly Thornton is a 65 y.o. 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 Patient's family reports that she has had recent acute change in mental status with failing memory. She had quit her job 10 days ago because of this. However, tonight her new baseline was that she was still able to ambulate, go to the bathroom by herself and perform most of her ADLs, and answer most questions appropriately. She went to church last night. Tonight, her husband reports that he heard her call out his name, and when he arrived she had fallen to the ground and was no longer verbally responsive. She was having tonic-clonic jerking of the extremities and her eyes had rolled backwards.  She was unresponsive during the episode which lasted several minutes. Afterwards, was alert but nonresponsive, with no obvious injury. EMS reported a normal blood sugar. No other recent illness including cough cold symptoms, nausea vomiting or diarrhea, abdominal pain or chest pain. Patient's daughter is concerned about possible overdose on anti-anxiolytics, although she does not know if this is possible.     Past Medical History  Diagnosis Date  . Right breast cancer with malignant cells in regional lymph nodes no greater than 0.2 mm and no more than 200 cells ([place])   . Arthritis   . GERD (gastroesophageal reflux disease)   . Stomach cancer ([place])   . Family history of adverse reaction to anesthesia     mother had nausea  . Glaucoma   . Anxiety     Patient Active Problem List   Diagnosis Date Noted   . 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 Malnutrition of moderate degree ([place]) 02/05/2015  . Gastric cancer ([place]) 11/11/2014  . Cancer of body of stomach ([place]) 10/19/2014  . Breast cancer ([place]) 10/15/2014  . Acid reflux 04/08/2014    Past Surgical History  Procedure Laterality Date  . Abdominal hysterectomy    . Portacath placement  05/01/2014    Dr. Pat Patrick  . Gastrectomy N/A 11/11/2014    Procedure: Scarlett Presto I, GASTRODUODENOSTOMY, IOUS OF LIVER ;  Surgeon: Molly Maduro, MD;  Location: [facility] ORS;  Service: General;  Laterality: N/A;  . Simple mastectomy with axillary sentinel node biopsy Right 02/04/2015    Procedure: SIMPLE MASTECTOMY;  Surgeon: Dia Crawford III, MD;  Location: [facility] ORS;  Service: General;  Laterality: Right;    Current Outpatient Rx  Name  Route  Sig  Dispense  Refill  . acetaminophen (TYLENOL) 500 MG tablet   Oral   Take 500 mg by mouth every 6 (six) hours as needed for moderate pain or headache.         . ALPRAZolam (XANAX) 0.25 MG tablet   Oral   Take 1 tablet (0.25 mg total) by mouth 2 (two) times daily at 10 am and 4 pm.   60 tablet   3   . citalopram (CELEXA) 20 MG tablet   Oral   Take 1 tablet (20 mg total) by mouth daily.   90 tablet   3   .  LORazepam (ATIVAN) 0.5 MG tablet   Oral   Take 1 tablet (0.5 mg total) by mouth at bedtime as needed for anxiety.   30 tablet   3   . metoCLOPramide (REGLAN) 10 MG tablet   Oral   Take 1 tablet (10 mg total) by mouth 2 (two) times daily.   60 tablet   3   . Multiple Vitamins-Minerals (MULTIVITAMIN WITH MINERALS) tablet   Oral   Take 1 tablet by mouth daily.         Marland Kitchen omeprazole (PRILOSEC) 40 MG capsule   Oral   Take 40 mg by mouth 2 (two) times daily.       2   . ondansetron (ZOFRAN-ODT) 4 MG disintegrating tablet   Oral   Take 1 tablet (4 mg total) by mouth every 4 (four) hours as needed for  nausea.   30 tablet   3   . ranitidine (ZANTAC) 150 MG tablet   Oral   Take 150 mg by mouth 2 (two) times daily.         . traMADol (ULTRAM) 50 MG tablet   Oral   Take 1 tablet (50 mg total) by mouth every 4 (four) hours as needed (Mild pain).   60 tablet   0   . multivitamin (GERI-TONIC) LIQD   Oral   Take 15 mLs by mouth daily.           Allergies Codeine  Family History  Problem Relation Age of Onset  . Breast cancer Mother   . Stomach cancer Mother 42  . Lung cancer Father   . Heart attack Father   . Hypertension Father     Social History Social History  Substance Use Topics  . Smoking status: Former Smoker    Quit date: 07/09/1996  . Smokeless tobacco: Never Used  . Alcohol Use: No    Review of Systems Constitutional: No fever/chills Eyes: No known visual changes ENT: No rhinorrhea or congestion Cardiovascular: Denies chest pain, palpitations. Respiratory: Denies shortness of breath.  No cough. Gastrointestinal: No abdominal pain.  No nausea, no vomiting.  No diarrhea.  No constipation. Genitourinary: Negative for dysuria. Musculoskeletal: Negative for back pain. Skin: Negative for rash. Neurological: Negative for headaches, focal weakness or numbness. Positive for recent significant decline in memory. Psych: Family reports significant anxiety.  10-point ROS otherwise negative.  ____________________________________________   PHYSICAL EXAM:  VITAL SIGNS: ED Triage Vitals  Enc Vitals Group     BP 07/07/15 1945 154/87 mmHg     Pulse Rate 07/07/15 1945 74     Resp 07/07/15 1945 14     Temp --      Temp src --      SpO2 07/07/15 1945 97 %     Weight --      Height --      Head Cir --      Peak Flow --      Pain Score --      Pain Loc --      Pain Edu? --      Excl. in [place]? --     Constitutional: Patient is alert but nonverbal except that she told me her name one time. She looks around the room but not does not appear to recognize her  family or no where she is. She is in no acute distress and is maintaining her airway at this time.  Eyes: Conjunctivae are normal.  EOMI. no raccoon eyes. PERRLA. Head: Atraumatic.  No Battle sign. Facial instability. Nose: No congestion/rhinnorhea. Mouth/Throat: Mucous membranes are dry.  Neck: No stridor.  Supple.  No JVD. Cardiovascular: Normal rate, regular rhythm. No murmurs, rubs or gallops. Right mastectomy. Respiratory: Normal respiratory effort.  No retractions. Lungs CTAB.  No wheezes, rales or ronchi. Gastrointestinal: Soft and nontender. No distention. No peritoneal signs. Musculoskeletal: No LE edema. No palpable cords, calf tenderness to palpation. Patient appears to have guarding of the left arm with full range of motion of the left wrist but winces with range of motion of the left elbow and shoulder. No obvious asymmetry of the humeral heads. Normal left radial pulse. Neurologic:  Normal speech and language. No gross focal neurologic deficits are appreciated.  Skin:  Skin is warm, dry and intact. No rash noted. Psychiatric: Unable to assess  ____________________________________________   LABS (all labs ordered are listed, but only abnormal results are displayed)  Labs Reviewed  URINALYSIS COMPLETEWITH MICROSCOPIC ([facility] ONLY) - Abnormal; Notable for the following:    Color, Urine YELLOW (*)    APPearance CLEAR (*)    Leukocytes, UA 1+ (*)    All other components within normal limits  CBC - Abnormal; Notable for the following:    WBC 2.8 (*)    RBC 2.21 (*)    Hemoglobin 6.1 (*)    HCT 19.0 (*)    RDW 14.7 (*)    Platelets 75 (*)    All other components within normal limits  TROPONIN I - Abnormal; Notable for the following:    Troponin I 0.09 (*)    All other components within normal limits  COMPREHENSIVE METABOLIC PANEL - Abnormal; Notable for the following:    Glucose, Bld 113 (*)    Calcium 7.9 (*)    Total Protein 6.2 (*)    Albumin 3.4 (*)    ALT 9 (*)     Total Bilirubin 0.1 (*)    All other components within normal limits  CBC - Abnormal; Notable for the following:    RDW 15.6 (*)    Platelets 137 (*)    All other components within normal limits  GLUCOSE, CAPILLARY - Abnormal; Notable for the following:    Glucose-Capillary 105 (*)    All other components within normal limits  URINE DRUG SCREEN, QUALITATIVE ([facility] ONLY)  AMMONIA  ETHANOL  CBG MONITORING, ED   ____________________________________________  EKG  ED ECG REPORT I, Eula Listen, the attending physician, personally viewed and interpreted this ECG.   Date: 07/07/2015  EKG Time: 1941  Rate: 81  Rhythm: normal sinus rhythm  Axis: Normal  Intervals:none  ST&T Change: Specific T-wave inversions in V1  ____________________________________________  RADIOLOGY  No results found.  ____________________________________________   PROCEDURES  Procedure(s) performed: None  Critical Care performed: No ____________________________________________   INITIAL IMPRESSION / ASSESSMENT AND PLAN / ED COURSE  Pertinent labs & imaging results that were available during my care of the patient were reviewed by me and considered in my medical decision making (see chart for details).  65 y.o. female with ongoing treatment for metastatic breast cancer presenting with unwitnessed fall, tonic-clonic activity, with ongoing altered mental status and possible injury to the left arm. I am concerned about metastatic disease to the brain as well as possible edema or bleeding in the brain. It is possible she is postictal. We'll evaluate for infection although she is afebrile and has not recently had any symptoms that would be consistent with infection.  ----------------------------------------- 9:35 PM on 07/07/2015 -----------------------------------------  Patient continues to remain hemodynamically stable. I'm awaiting the results of her laboratory studies as well as her  imaging. I have signed the patient out to Dr. Jacqualine Code will follow up the remainder of her diagnostic evaluation, reevaluate the patient, and make a final disposition..  ____________________________________________  FINAL CLINICAL IMPRESSION(S) / ED DIAGNOSES  Final diagnoses:  Seizure ([place])  Altered mental status, unspecified altered mental status type  Left arm pain  Acute memory impairment  Brain mass  Vasogenic brain edema [facility])      NEW MEDICATIONS STARTED DURING THIS VISIT:  Discharge Medication List as of 07/08/2015  2:06 AM       Eula Listen, MD 07/09/15 2248

## 2015-07-07 NOTE — ED Notes (Addendum)
Lab called for hemolysis of blood work. RN will have to redraw

## 2015-07-07 NOTE — ED Notes (Signed)
Lab called RN for incompatible values, Rn will redraw blood work 1 more time

## 2015-07-07 NOTE — ED Provider Notes (Addendum)
Assume care from Dr. Mariea Clonts. The patient approximately 10:45 PM.  Patient is alert but clearly confused. She does follow basic commands but does not have orientation to date or time. She does recognize family. Able to move all extremities. Symmetric smile. She is however demonstrated some mild word finding difficulty.  I canceled CT the head with contrast is a my evaluation as concerned about the possibility acute intracranial hemorrhage and labs have been delayed. All labs are now been resent and are pending.  Given initial CBC which is thought to have been drawn off a line with saline infusion causing dilution per the RN, I have performed a rectal exam with nurse present and it was heme negative. Family reports the patient has not had any vomiting or dark or black stools.   CT Head Wo Contrast (Final result) Result time: 07/07/15 23:43:35   Final result by Rad Results In Interface (07/07/15 23:43:35)   Narrative:   CLINICAL DATA: Unresponsive. Seizure and headache. Patient with history of malignancy.  EXAM: CT HEAD WITHOUT CONTRAST  TECHNIQUE: Contiguous axial images were obtained from the base of the skull through the vertex without intravenous contrast.  COMPARISON: No prior head imaging.  FINDINGS: Multifocal regions of confluent white matter hypodensity, with bifrontal, left temporal, right periventricular, and left occipital involvement. Within the anterior left frontal hypodensity are small punctate hyperdense foci. A similar hyperdense focus is seen in the posterior right frontal lobe. There is no midline shift. Mild generalized atrophy without hydrocephalus. No intracranial fluid collection. Fluid level in the sphenoid sinus. Mastoid air cells are well aerated. Bony calvarium intact.  IMPRESSION: Multifocal regions of confluent white matter hypodensity, with differential considerations of edema from brain lesions (metastatic disease) versus ischemia. Internal  punctate hyperdensities in the frontal lobes may reflect punctate marker hemorrhages. Brain MRI, preferably with contrast recommended for further characterization.  These results were called by telephone at the time of interpretation on 07/07/2015 at 11:37 pm to Dr. Delman Kitten , who verbally acknowledged these results.   Electronically Signed By: Jeb Levering M.D. On: 07/07/2015 23:43    I am extremely concerned about the possibility of malignancy. The family reports that she's been having increased confusion recently quit her job about 2 weeks ago because of cognitive difficulties. Her oncologist was also concerned and was planning to outpatient imaging of the brain per the daughter.  ----------------------------------------- 11:47 PM on 07/07/2015 -----------------------------------------  Await labs. Patient remains hemodynamically stable, mental status slowly improving. Discussed with family and plan to transfer the patient was preference for [facility], if unavailable likely to [facility].  ----------------------------------------- 12:22 AM on 07/08/2015 -----------------------------------------  Patient slowly showing improved mental status. Decadron ordered. Patient accepted in transfer to neurosurgical service at the [facility]. Ongoing care and follow-up on CBC and ongoing ER monitoring assigned to Dr. Owens Shark. Patient awaiting bed assignment, thereafter will need transfer via advanced life support service to [facility].  Patient family aware and agreeable with plan.   Delman Kitten, MD 07/08/15 0023  CRITICAL CARE Performed by: Delman Kitten   Total critical care time: 40 minutes  Critical care time was exclusive of separately billable procedures and treating other patients.  Critical care was necessary to treat or prevent imminent or life-threatening deterioration.  Critical care was time spent personally by me on  the following activities: development of treatment plan with patient and/or surrogate as well as nursing, discussions with consultants, evaluation of patient's response to treatment, examination  of patient, obtaining history from patient or surrogate, ordering and performing treatments and interventions, ordering and review of laboratory studies, ordering and review of radiographic studies, pulse oximetry and re-evaluation of patient's condition.  Includes time arranging transfer to neurosurgical service at outside hospital. Patient with probable intracranial hemorrhage associated with what I believe is most likely frontal mass, feel less likely an acute ischemic insult. Nonetheless with a history of associated seizure preceding the patient would not be a TPA candidate and clearly not a candidate with intracranial hemorrhage suspected. She is not on any anticoagulants.  Delman Kitten, MD 07/08/15 [phone]

## 2015-07-07 NOTE — ED Notes (Signed)
Pt from home via EMS for unresponsiveness , EMS found pt alert . Family states pt went to get out of chair and heard her fall and had seizure like activity. Pt no hx of seizure. Pt alert but nonverbal at this time. Pt has hx of breast and stomach cancer. Family at bedside . VS stable

## 2015-07-07 NOTE — ED Notes (Signed)
Cbc collected by lab

## 2015-07-08 ENCOUNTER — Inpatient Hospital Stay: Payer: BLUE CROSS/BLUE SHIELD | Admitting: Oncology

## 2015-07-08 ENCOUNTER — Telehealth: Payer: Self-pay | Admitting: *Deleted

## 2015-07-08 ENCOUNTER — Inpatient Hospital Stay: Payer: BLUE CROSS/BLUE SHIELD

## 2015-07-08 DIAGNOSIS — C7931 Secondary malignant neoplasm of brain: Secondary | ICD-10-CM | POA: Insufficient documentation

## 2015-07-08 LAB — CBC
HEMATOCRIT: 36.3 % (ref 35.0–47.0)
HEMOGLOBIN: 12 g/dL (ref 12.0–16.0)
MCH: 28 pg (ref 26.0–34.0)
MCHC: 33 g/dL (ref 32.0–36.0)
MCV: 84.8 fL (ref 80.0–100.0)
Platelets: 137 10*3/uL — ABNORMAL LOW (ref 150–440)
RBC: 4.28 MIL/uL (ref 3.80–5.20)
RDW: 15.6 % — AB (ref 11.5–14.5)
WBC: 6.7 10*3/uL (ref 3.6–11.0)

## 2015-07-08 NOTE — ED Notes (Signed)
Lab called for critical troponnin of 0.09, MD Quale notified

## 2015-07-08 NOTE — Telephone Encounter (Signed)
Had to take pt to ER last night and she was transferred to [facility] for neurosurgeon due to a brain tumor. IMPRESSION: Multifocal regions of confluent white matter hypodensity, with differential considerations of edema from brain lesions (metastatic disease) versus ischemia. Internal punctate hyperdensities in the frontal lobes may reflect punctate marker hemorrhages. Brain MRI, preferably with contrast recommended for further characterization.  These results were called by telephone at the time of interpretation on 07/07/2015 at 11:37 pm to Dr. Delman Kitten , who verbally acknowledged these results.   Electronically Signed  By: Jeb Levering M.D.  On: 07/07/2015 23:43

## 2015-07-08 NOTE — ED Notes (Signed)
CBC drawn at 21:52 inaccurate due to saline fluid infusing.

## 2015-07-08 NOTE — ED Notes (Signed)
[facility] transfer called to give ETA of pt , EMS transporting at this time

## 2015-07-10 ENCOUNTER — Telehealth: Payer: Self-pay | Admitting: *Deleted

## 2015-07-10 NOTE — Telephone Encounter (Signed)
Asking for call from MD regarding her mother, dx and treatment that [facility] is propsing Can you call her to discuss? [phone]

## 2015-07-10 NOTE — Telephone Encounter (Signed)
Asking for call from Dr Oliva Bustard to discuss her mothers recent dx and treatment proposed by [facility]. Please call her at [phone]

## 2015-07-14 ENCOUNTER — Encounter: Payer: Self-pay | Admitting: Radiation Oncology

## 2015-07-14 ENCOUNTER — Encounter: Payer: Self-pay | Admitting: Oncology

## 2015-07-14 ENCOUNTER — Inpatient Hospital Stay: Payer: BLUE CROSS/BLUE SHIELD | Attending: Oncology | Admitting: Oncology

## 2015-07-14 ENCOUNTER — Ambulatory Visit
Admission: RE | Admit: 2015-07-14 | Discharge: 2015-07-14 | Disposition: A | Discharge status: Home / Self Care | Payer: BLUE CROSS/BLUE SHIELD | Source: Ambulatory Visit | Attending: Radiation Oncology | Admitting: Radiation Oncology

## 2015-07-14 VITALS — BP 122/77 | HR 50 | Temp 97.2°F | Resp 18 | Wt 101.0 lb

## 2015-07-14 VITALS — BP 113/72 | HR 53 | Temp 96.6°F | Resp 18 | Wt 101.6 lb

## 2015-07-14 DIAGNOSIS — F419 Anxiety disorder, unspecified: Secondary | ICD-10-CM | POA: Insufficient documentation

## 2015-07-14 DIAGNOSIS — C778 Secondary and unspecified malignant neoplasm of lymph nodes of multiple regions: Secondary | ICD-10-CM | POA: Diagnosis not present

## 2015-07-14 DIAGNOSIS — M1990 Unspecified osteoarthritis, unspecified site: Secondary | ICD-10-CM | POA: Insufficient documentation

## 2015-07-14 DIAGNOSIS — M129 Arthropathy, unspecified: Secondary | ICD-10-CM | POA: Insufficient documentation

## 2015-07-14 DIAGNOSIS — Z87891 Personal history of nicotine dependence: Secondary | ICD-10-CM | POA: Insufficient documentation

## 2015-07-14 DIAGNOSIS — C169 Malignant neoplasm of stomach, unspecified: Secondary | ICD-10-CM | POA: Insufficient documentation

## 2015-07-14 DIAGNOSIS — C7931 Secondary malignant neoplasm of brain: Secondary | ICD-10-CM | POA: Insufficient documentation

## 2015-07-14 DIAGNOSIS — R1310 Dysphagia, unspecified: Secondary | ICD-10-CM | POA: Insufficient documentation

## 2015-07-14 DIAGNOSIS — R1013 Epigastric pain: Secondary | ICD-10-CM | POA: Insufficient documentation

## 2015-07-14 DIAGNOSIS — Z171 Estrogen receptor negative status [ER-]: Secondary | ICD-10-CM | POA: Diagnosis not present

## 2015-07-14 DIAGNOSIS — C50911 Malignant neoplasm of unspecified site of right female breast: Secondary | ICD-10-CM | POA: Insufficient documentation

## 2015-07-14 DIAGNOSIS — C787 Secondary malignant neoplasm of liver and intrahepatic bile duct: Secondary | ICD-10-CM | POA: Insufficient documentation

## 2015-07-14 DIAGNOSIS — Z510 Encounter for antineoplastic radiation therapy: Secondary | ICD-10-CM | POA: Insufficient documentation

## 2015-07-14 DIAGNOSIS — Z9221 Personal history of antineoplastic chemotherapy: Secondary | ICD-10-CM | POA: Insufficient documentation

## 2015-07-14 DIAGNOSIS — C50411 Malignant neoplasm of upper-outer quadrant of right female breast: Secondary | ICD-10-CM | POA: Insufficient documentation

## 2015-07-14 DIAGNOSIS — K219 Gastro-esophageal reflux disease without esophagitis: Secondary | ICD-10-CM | POA: Diagnosis not present

## 2015-07-14 DIAGNOSIS — Z800 Family history of malignant neoplasm of digestive organs: Secondary | ICD-10-CM | POA: Insufficient documentation

## 2015-07-14 DIAGNOSIS — C162 Malignant neoplasm of body of stomach: Secondary | ICD-10-CM

## 2015-07-14 DIAGNOSIS — Z79899 Other long term (current) drug therapy: Secondary | ICD-10-CM | POA: Insufficient documentation

## 2015-07-14 DIAGNOSIS — Z170 Estrogen receptor positive status [ER+]: Secondary | ICD-10-CM | POA: Insufficient documentation

## 2015-07-14 DIAGNOSIS — R419 Unspecified symptoms and signs involving cognitive functions and awareness: Secondary | ICD-10-CM

## 2015-07-14 DIAGNOSIS — Z9011 Acquired absence of right breast and nipple: Secondary | ICD-10-CM | POA: Insufficient documentation

## 2015-07-14 DIAGNOSIS — R51 Headache: Secondary | ICD-10-CM | POA: Diagnosis not present

## 2015-07-14 DIAGNOSIS — Z803 Family history of malignant neoplasm of breast: Secondary | ICD-10-CM | POA: Insufficient documentation

## 2015-07-14 DIAGNOSIS — Z801 Family history of malignant neoplasm of trachea, bronchus and lung: Secondary | ICD-10-CM | POA: Insufficient documentation

## 2015-07-14 NOTE — Consult Note (Signed)
Except an outstanding is perfect of Radiation Oncology NEW PATIENT EVALUATION  Name: Kimberly Thornton  MRN: [id number]  Date:   07/14/2015     DOB: December 17, 1950   This 65 y.o. 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  REFERRING PHYSICIAN: Sofie Hartigan, MD  CHIEF COMPLAINT:  Chief Complaint  Patient presents with  . Cancer    Pt is here for initial consultation of brain metastasis.      DIAGNOSIS: The encounter diagnosis was Brain metastasis ([place]).   PREVIOUS INVESTIGATIONS:  MRI scans and CT scans without contrast reviewed Clinical notes reviewed Case discussed with medical oncology personally  HPI: Patient is a 65 year old female with history of breast cancer 4 cm lesion with 3 positive lymph nodes HER-2/neu overexpressed ER positive PR negative. She was also found to have gastric carcinoma status post resection and adjuvant chemotherapy. 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 She had been on maintenance Herceptin. She recently presented to the emergency room with increasing confusion was found on noncontrast enhanced CT scan to have multiple areas of confluent white matter hypodensity consistent with brain metastasis. This was confirmed at [facility] on MRI scan showing at least 5 brain metastasis with significant edema. Patient was started on steroids was offered stereotactic radiosurgery. Is seen today for consideration of radiation therapy for palliation of her brain metastasis. She is fairly lethargic at this time is able to answer questions slowly and deliberately. She's having no sniffing headaches at this time appetite is good she is on 4 mg of ducts Decadron 4 times a day. She does not complain of any change in visual fields or any focal motor or sensory levels.  PLANNED TREATMENT REGIMEN: Whole brain radiation  PAST MEDICAL HISTORY:  has a past medical history of Right breast  cancer with malignant cells in regional lymph nodes no greater than 0.2 mm and no more than 200 cells ([place]); Arthritis; GERD (gastroesophageal reflux disease); Stomach cancer ([place]); Family history of adverse reaction to anesthesia; Glaucoma; and Anxiety.    PAST SURGICAL HISTORY:  Past Surgical History  Procedure Laterality Date  . Abdominal hysterectomy    . Portacath placement  05/01/2014    Dr. Pat Patrick  . Gastrectomy N/A 11/11/2014    Procedure: Scarlett Presto I, GASTRODUODENOSTOMY, IOUS OF LIVER ;  Surgeon: Molly Maduro, MD;  Location: [facility] ORS;  Service: General;  Laterality: N/A;  . Simple mastectomy with axillary sentinel node biopsy Right 02/04/2015    Procedure: SIMPLE MASTECTOMY;  Surgeon: Dia Crawford III, MD;  Location: [facility] ORS;  Service: General;  Laterality: Right;    FAMILY HISTORY: family history includes Breast cancer in her mother; Heart attack in her father; Hypertension in her father; Lung cancer in her father; Stomach cancer (age of onset: 38) in her mother.  SOCIAL HISTORY:  reports that she quit smoking about 19 years ago. She has never used smokeless tobacco. She reports that she does not drink alcohol or use illicit drugs.  ALLERGIES: Codeine  MEDICATIONS:  Current Outpatient Prescriptions  Medication Sig Dispense Refill  . ALPRAZolam (XANAX) 0.25 MG tablet Take 1 tablet (0.25 mg total) by mouth 2 (two) times daily at 10 am and 4 pm. 60 tablet 3  . brimonidine-timolol (COMBIGAN) 0.2-0.5 % ophthalmic solution Administer 1 drop into the left eye Two (2) times a day.    . citalopram (CELEXA) 20 MG tablet Take 1 tablet (20 mg total)  by mouth daily. 90 tablet 3  . dexamethasone (DECADRON) 4 MG tablet Take 4 mg by mouth.    . levETIRAcetam (KEPPRA) 1000 MG tablet Take 1,000 mg by mouth.    . metoCLOPramide (REGLAN) 10 MG tablet Take 1 tablet (10 mg total) by mouth 2 (two) times daily. 60 tablet 3  . Multiple Vitamins-Minerals (MULTIVITAMIN WITH MINERALS) tablet Take 1  tablet by mouth daily.    . multivitamin (GERI-TONIC) LIQD Take 15 mLs by mouth daily.    Marland Kitchen omeprazole (PRILOSEC) 40 MG capsule Take 40 mg by mouth 2 (two) times daily.   2  . ondansetron (ZOFRAN-ODT) 4 MG disintegrating tablet Take 1 tablet (4 mg total) by mouth every 4 (four) hours as needed for nausea. 30 tablet 3  . ranitidine (ZANTAC) 150 MG tablet Take 150 mg by mouth 2 (two) times daily.    . traMADol (ULTRAM) 50 MG tablet Take 1 tablet (50 mg total) by mouth every 4 (four) hours as needed (Mild pain). 60 tablet 0  . acetaminophen (TYLENOL) 500 MG tablet Take 500 mg by mouth every 6 (six) hours as needed for moderate pain or headache.     No current facility-administered medications for this encounter.   Facility-Administered Medications Ordered in Other Encounters  Medication Dose Route Frequency Provider Last Rate Last Dose  . heparin lock flush 100 unit/mL  500 Units Intravenous Once Forest Gleason, MD      . sodium chloride 0.9 % injection 10 mL  10 mL Intracatheter PRN Forest Gleason, MD        ECOG PERFORMANCE STATUS:  2 - Symptomatic, <50% confined to bed  REVIEW OF SYSTEMS: Except for the recent confusion Patient denies any weight loss, fatigue, weakness, fever, chills or night sweats. Patient denies any loss of vision, blurred vision. Patient denies any ringing  of the ears or hearing loss. No irregular heartbeat. Patient denies heart murmur or history of fainting. Patient denies any chest pain or pain radiating to her upper extremities. Patient denies any shortness of breath, difficulty breathing at night, cough or hemoptysis. Patient denies any swelling in the lower legs. Patient denies any nausea vomiting, vomiting of blood, or coffee ground material in the vomitus. Patient denies any stomach pain. Patient states has had normal bowel movements no significant constipation or diarrhea. Patient denies any dysuria, hematuria or significant nocturia. Patient denies any problems walking,  swelling in the joints or loss of balance. Patient denies any skin changes, loss of hair or loss of weight. Patient denies any excessive worrying or anxiety or significant depression. Patient denies any problems with insomnia. Patient denies excessive thirst, polyuria, polydipsia. Patient denies any swollen glands, patient denies easy bruising or easy bleeding. Patient denies any recent infections, allergies or URI. Patient "s visual fields have not changed significantly in recent time.    PHYSICAL EXAM: BP 113/72 mmHg  Pulse 53  Temp(Src) 96.6 F (35.9 C)  Resp 18  Wt 101 lb 10.1 oz (46.1 kg) Well-developed wheelchair-bound female in NAD crude visual fields are within normal range cranial nerves II through XII are grossly intact motor sensory and DTR levels are equal and symmetric in the upper and lower extremities. Well-developed well-nourished patient in NAD. HEENT reveals PERLA, EOMI, discs not visualized.  Oral cavity is clear. No oral mucosal lesions are identified. Neck is clear without evidence of cervical or supraclavicular adenopathy. Lungs are clear to A&P. Cardiac examination is essentially unremarkable with regular rate and rhythm without murmur rub or  thrill. Abdomen is benign with no organomegaly or masses noted. Motor sensory and DTR levels are equal and symmetric in the upper and lower extremities. Cranial nerves II through XII are grossly intact. Proprioception is intact. No peripheral adenopathy or edema is identified. No motor or sensory levels are noted. Crude visual fields are within normal range.  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 Risks and benefits of treatment including hair loss fatigue irritation of the skin and possible cognitive decline all were discussed in detail with the family. I have set up and ordered CT simulation for tomorrow. Patient is seeing medical oncology today I have discussed the case personally with medical oncology.  I would like to take this opportunity for allowing me to participate in the care of your patient.Armstead Peaks., MD

## 2015-07-14 NOTE — Progress Notes (Signed)
[place] @ [facility] [phone]  [phone]     Kimberly Thornton OB: [birth year]  MR#: [id number]  IOE#:[id number]  Patient Care Team: Sofie Hartigan, MD as PCP - General (Family Medicine) Dia Crawford III, MD (Surgery) Clent Jacks, RN as Registered Nurse  CHIEF COMPLAINT:  Chief Complaint  Patient presents with  . Breast Cancer  oncology history Chief Complaint/Diagnosis:   1. Carcinoma of breast right upper quadrant  (4 cm tumor mass) 3 positive lymph node on needle biopsy estrogen receptor progesterone receptor negative.  HER-2/neu receptor positiveby IHC 3+ Diagnosis November, 2015, T2 N1 M0 tumor stage II disease 2. Muga SCAN OF THE HEART EJECTION FRACTION 66% 3. Abnormal PET scan suggesting a mass in the stomach, and liver metastases 4. Upper endoscopy given fungating mass biopsies consistent with stomach primary cancer, HER-2/neu by IHC 2+ and by fish is 1.86. 5. Liver biopsies negative for any malignancy. 6. BRCA 2 sequencing deleterious mutation in 2014 ins As mentioned above. 7. ENDOSCOPY ULTRASOUND SHOWS STOMACH TUMOR TO BE t3 n0 m0  1.  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 August , 2016  Mastectomy right breast pT1 pNxMx  estrogen receptor  Negative.  Progesterone receptor negative.  HER-2/neu receptor positive tumor   She   is on maintenance Herceptin therapy since August of 2016 MUGA scan in November of 2016 stable 69%. 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  (February, 2017 Started on radiation therapy from February 8              INTERVAL HISTORY:  65 year old lady who is now working full-time came today further follow-up.  Appetite is slowly improving.  Energy level has improved somewhat.  Hemoglobin is 10 g. Patient does not have any nausea vomiting  has gained 5 pounds of weight. Last MUGA scan was in November reported to be stable.  Here for further follow-up and treatment consideration. Patient complains of pain in epigastric area.  Just dull aching constant. Patient reports extreme stress related to the work  Patient started with chronic and chronic seizure activity and was taken to the emergency room patient had altered mental status.  Patient was referred to [facility] an MRI scan did reveal multiple brain metastases which are inoperable. Patient is now referred back for radiation therapy Patient is on Route 1000 mg twice a day and Decadron 4 times a day Complains of mild headache. Courses of activity notice. Recent has a limited ambulation.  Continue to have difficulty forming words REVIEW OF SYSTEMS:   GENERAL:  Feels good.  Active.  No fevers, sweats or weight loss. PERFORMANCE STATUS (ECOG): 01 HEENT:  No visual changes, runny nose, sore throat, mouth sores or tenderness. Lungs: No shortness of breath or cough.  No hemoptysis. Cardiac:  No chest pain, palpitations, orthopnea, or PND. GI:  No nausea, vomiting, diarrhea, constipation, melena or hematochezia. GU:  No urgency, frequency, dysuria, or hematuria. Musculoskeletal:  No back pain.  No joint pain.  No muscle tenderness. Extremities:  No pain or swelling. Skin:  No rashes or skin changes. Neuro:  No headache, numbness or weakness, balance or coordination issues. Endocrine:  No diabetes, thyroid issues, hot flashes or night sweats. Psych:  No mood changes, depression or anxiety. Pain:  No focal pain. Review of systems:  All other systems reviewed and found to be  negative.   PAST MEDICAL HISTORY: Past Medical History  Diagnosis Date  . Right breast cancer with malignant cells in regional lymph nodes no greater than 0.2 mm and no more than 200 cells ([place])   . Arthritis   . GERD (gastroesophageal reflux disease)   . Stomach cancer ([place])   . Family history of  adverse reaction to anesthesia     mother had nausea  . Glaucoma   . Anxiety     PAST SURGICAL HISTORY: Past Surgical History  Procedure Laterality Date  . Abdominal hysterectomy    . Portacath placement  05/01/2014    Dr. Pat Patrick  . Gastrectomy N/A 11/11/2014    Procedure: Scarlett Presto I, GASTRODUODENOSTOMY, IOUS OF LIVER ;  Surgeon: Molly Maduro, MD;  Location: [facility] ORS;  Service: General;  Laterality: N/A;  . Simple mastectomy with axillary sentinel node biopsy Right 02/04/2015    Procedure: SIMPLE MASTECTOMY;  Surgeon: Dia Crawford III, MD;  Location: [facility] ORS;  Service: General;  Laterality: Right;    FAMILY HISTORY  Significant History/PMH:   Arthritis:    Glaucoma:    Breast Cancer:    GERD - Esophageal Reflux:    Colonoscopy:    Hysterectomy:   Preventive Screening:  Has patient had any of the following test? Colonscopy  Mammography  Pap Smear (1)   Last Colonoscopy: 2010(1)   Last Mammography: 2015(1)   Last Pap Smear: 2012-hysterectomy in 1970's(1)   Smoking History: Smoking History Cigarettes per day and smoked for 25 years less than a half pack per day; quit 17 years ago(1)  PFSH: Comments: mother had a breast cancer when she was 69 years old.mother sister had breast cancer.  Mother also has stomach cancer  Social History: negative alcohol, negative tobacco  Additional Past Medical and Surgical History: gastroesophageal reflux disease  Arthritis  Hysterectomy.  Ovaries were not removed           ADVANCED DIRECTIVES: Patient does not have any advanced healthcare directive. Information has been given.   HEALTH MAINTENANCE: Social History  Substance Use Topics  . Smoking status: Former Smoker    Quit date: 07/09/1996  . Smokeless tobacco: Never Used  . Alcohol Use: No      Allergies  Allergen Reactions  . Codeine Other (See Comments) and Nausea Only    GI distress    Current Outpatient Prescriptions  Medication Sig Dispense  Refill  . acetaminophen (TYLENOL) 500 MG tablet Take 500 mg by mouth every 6 (six) hours as needed for moderate pain or headache.    . ALPRAZolam (XANAX) 0.25 MG tablet Take 1 tablet (0.25 mg total) by mouth 2 (two) times daily at 10 am and 4 pm. 60 tablet 3  . brimonidine-timolol (COMBIGAN) 0.2-0.5 % ophthalmic solution Administer 1 drop into the left eye Two (2) times a day.    . citalopram (CELEXA) 20 MG tablet Take 1 tablet (20 mg total) by mouth daily. 90 tablet 3  . dexamethasone (DECADRON) 4 MG tablet Take 8 mg by mouth 2 (two) times daily.     Marland Kitchen levETIRAcetam (KEPPRA) 1000 MG tablet Take 1,000 mg by mouth 2 (two) times daily.     . metoCLOPramide (REGLAN) 10 MG tablet Take 1 tablet (10 mg total) by mouth 2 (two) times daily. 60 tablet 3  . Multiple Vitamins-Minerals (MULTIVITAMIN WITH MINERALS) tablet Take 1 tablet by mouth daily.    . multivitamin (GERI-TONIC) LIQD Take 15 mLs by mouth daily.    Marland Kitchen omeprazole (  PRILOSEC) 40 MG capsule Take 40 mg by mouth 2 (two) times daily.   2  . ondansetron (ZOFRAN-ODT) 4 MG disintegrating tablet Take 1 tablet (4 mg total) by mouth every 4 (four) hours as needed for nausea. 30 tablet 3  . ranitidine (ZANTAC) 150 MG tablet Take 150 mg by mouth 2 (two) times daily.    . traMADol (ULTRAM) 50 MG tablet Take 1 tablet (50 mg total) by mouth every 4 (four) hours as needed (Mild pain). 60 tablet 0   No current facility-administered medications for this visit.   Facility-Administered Medications Ordered in Other Visits  Medication Dose Route Frequency Provider Last Rate Last Dose  . heparin lock flush 100 unit/mL  500 Units Intravenous Once Forest Gleason, MD      . sodium chloride 0.9 % injection 10 mL  10 mL Intracatheter PRN Forest Gleason, MD        OBJECTIVE:  Filed Vitals:   07/14/15 1457  BP: 122/77  Pulse: 50  Temp: 97.2 F (36.2 C)  Resp: 18     Body mass index is 20.39 kg/(m^2).    ECOG FS:1 - Symptomatic but completely ambulatory  PHYSICAL  EXAM: Gen. status: Patient is alert oriented not any acute distress slightly apprehensive.  Lymphatic system: No palpable supraclavicular cervical or axillary lymphadenopathy examination of breast: Right breast no palpable mass.  Left breast we have masses. Abdomen: Soft.  Liver and spleen not palpable.  Bowel sounds are present.  Lower extremity no swelling.  limit Cardiac exam revealed the PMI to be normally situated and sized. The rhythm was regular and no extrasystoles were noted during several minutes of auscultation. The first and second heart sounds were normal and physiologic splitting of the second heart sound was noted. There were no murmurs, rubs, clicks, or gallops. Examination of the chest was unremarkable. There were no bony deformities, no asymmetry, and no other abnormalities. Examination of the skin revealed no evidence of significant rashes, suspicious appearing nevi or other concerning lesions.  Lower extremity no edema  Neurological system: Higher functions patient has dysphagia .Power and tone in upper and lower extremity has uniformly decreased. Cranial nerves are intact LAB RESULTS:  No visits with results within 3 Day(s) from this visit. Latest known visit with results is:  Admission on 07/07/2015, Discharged on 07/08/2015  Component Date Value Ref Range Status  . Color, Urine 07/07/2015 YELLOW* YELLOW Final  . APPearance 07/07/2015 CLEAR* CLEAR Final  . Glucose, UA 07/07/2015 NEGATIVE  NEGATIVE mg/dL Final  . Bilirubin Urine 07/07/2015 NEGATIVE  NEGATIVE Final  . Ketones, ur 07/07/2015 NEGATIVE  NEGATIVE mg/dL Final  . Specific Gravity, Urine 07/07/2015 1.024  1.005 - 1.030 Final  . Hgb urine dipstick 07/07/2015 NEGATIVE  NEGATIVE Final  . pH 07/07/2015 5.0  5.0 - 8.0 Final  . Protein, ur 07/07/2015 NEGATIVE  NEGATIVE mg/dL Final  . Nitrite 07/07/2015 NEGATIVE  NEGATIVE Final  . Leukocytes, UA 07/07/2015 1+* NEGATIVE Final  . RBC / HPF 07/07/2015 0-5  0 - 5 RBC/hpf  Final  . WBC, UA 07/07/2015 6-30  0 - 5 WBC/hpf Final  . Bacteria, UA 07/07/2015 NONE SEEN  NONE SEEN Final  . Squamous Epithelial / LPF 07/07/2015 NONE SEEN  NONE SEEN Final  . Mucous 07/07/2015 PRESENT   Final  . Amorphous Crystal 07/07/2015 PRESENT   Final  . Tricyclic, Ur Screen 70/96/2836 NONE DETECTED  NONE DETECTED Final  . Amphetamines, Ur Screen 07/07/2015 NONE DETECTED  NONE DETECTED Final  .  MDMA (Ecstasy)Ur Screen 07/07/2015 NONE DETECTED  NONE DETECTED Final  . Cocaine Metabolite,Ur [place] 07/07/2015 NONE DETECTED  NONE DETECTED Final  . Opiate, Ur Screen 07/07/2015 NONE DETECTED  NONE DETECTED Final  . Phencyclidine (PCP) Ur S 07/07/2015 NONE DETECTED  NONE DETECTED Final  . Cannabinoid 50 Ng, Ur  07/07/2015 NONE DETECTED  NONE DETECTED Final  . Barbiturates, Ur Screen 07/07/2015 NONE DETECTED  NONE DETECTED Final  . Benzodiazepine, Ur Scrn 07/07/2015 NONE DETECTED  NONE DETECTED Final  . Methadone Scn, Ur 07/07/2015 NONE DETECTED  NONE DETECTED Final   Comment: (NOTE) 704  Tricyclics, urine               Cutoff 1000 ng/mL 200  Amphetamines, urine             Cutoff 1000 ng/mL 300  MDMA (Ecstasy), urine           Cutoff 500 ng/mL 400  Cocaine Metabolite, urine       Cutoff 300 ng/mL 500  Opiate, urine                   Cutoff 300 ng/mL 600  Phencyclidine (PCP), urine      Cutoff 25 ng/mL 700  Cannabinoid, urine              Cutoff 50 ng/mL 800  Barbiturates, urine             Cutoff 200 ng/mL 900  Benzodiazepine, urine           Cutoff 200 ng/mL 1000 Methadone, urine                Cutoff 300 ng/mL 1100 1200 The urine drug screen provides only a preliminary, unconfirmed 1300 analytical test result and should not be used for non-medical 1400 purposes. Clinical consideration and professional judgment should 1500 be applied to any positive drug screen result due to possible 1600 interfering substances. A more specific alternate chemical method 1700 must be used in order to  obtain a confirmed analytical result.  1800 Gas chromato                          graphy / mass spectrometry (GC/MS) is the preferred 1900 confirmatory method.   . WBC 07/07/2015 2.8* 3.6 - 11.0 K/uL Final  . RBC 07/07/2015 2.21* 3.80 - 5.20 MIL/uL Final  . Hemoglobin 07/07/2015 6.1* 12.0 - 16.0 g/dL Final  . HCT 07/07/2015 19.0* 35.0 - 47.0 % Final  . MCV 07/07/2015 86.0  80.0 - 100.0 fL Final  . MCH 07/07/2015 27.7  26.0 - 34.0 pg Final  . MCHC 07/07/2015 32.2  32.0 - 36.0 g/dL Final  . RDW 07/07/2015 14.7* 11.5 - 14.5 % Final  . Platelets 07/07/2015 75* 150 - 440 K/uL Final  . Ammonia 07/07/2015 23  9 - 35 umol/L Final  . Troponin I 07/07/2015 0.09* <0.031 ng/mL Final   Comment: READ BACK AND VERIFIED WITH JOYDEN LOYE AT 2336 ON 07/07/15 BY VAB        PERSISTENTLY INCREASED TROPONIN VALUES IN THE RANGE OF 0.04-0.49 ng/mL CAN BE SEEN IN:       -UNSTABLE ANGINA       -CONGESTIVE HEART FAILURE       -MYOCARDITIS       -CHEST TRAUMA       -ARRYHTHMIAS       -LATE PRESENTING MYOCARDIAL INFARCTION       -COPD  CLINICAL FOLLOW-UP RECOMMENDED.   Marland Kitchen Sodium 07-12-2015 135  135 - 145 mmol/L Final  . Potassium 07/12/2015 3.5  3.5 - 5.1 mmol/L Final  . Chloride 12-Jul-2015 107  101 - 111 mmol/L Final  . CO2 07/12/15 23  22 - 32 mmol/L Final  . Glucose, Bld 12-Jul-2015 113* 65 - 99 mg/dL Final  . BUN 2015-07-12 19  6 - 20 mg/dL Final  . Creatinine, Ser 07/12/2015 0.85  0.44 - 1.00 mg/dL Final  . Calcium 2015/07/12 7.9* 8.9 - 10.3 mg/dL Final  . Total Protein July 12, 2015 6.2* 6.5 - 8.1 g/dL Final  . Albumin 07/12/15 3.4* 3.5 - 5.0 g/dL Final  . AST 07/12/2015 17  15 - 41 U/L Final  . ALT 07/12/15 9* 14 - 54 U/L Final  . Alkaline Phosphatase 2015/07/12 48  38 - 126 U/L Final  . Total Bilirubin 07/12/15 0.1* 0.3 - 1.2 mg/dL Final  . GFR calc non Af Amer Jul 12, 2015 >60  >60 mL/min Final  . GFR calc Af Amer 2015-07-12 >60  >60 mL/min Final   Comment: (NOTE) The eGFR has been  calculated using the CKD EPI equation. This calculation has not been validated in all clinical situations. eGFR's persistently <60 mL/min signify possible Chronic Kidney Disease.   . Anion gap July 12, 2015 5  5 - 15 Final  . Alcohol, Ethyl (B) 2015/07/12 <5  <5 mg/dL Final   Comment:        LOWEST DETECTABLE LIMIT FOR SERUM ALCOHOL IS 5 mg/dL FOR MEDICAL PURPOSES ONLY   . WBC 2015/07/12 6.7  3.6 - 11.0 K/uL Final  . RBC 12-Jul-2015 4.28  3.80 - 5.20 MIL/uL Final  . Hemoglobin Jul 12, 2015 12.0  12.0 - 16.0 g/dL Final  . HCT Jul 12, 2015 36.3  35.0 - 47.0 % Final  . MCV 07-12-2015 84.8  80.0 - 100.0 fL Final  . MCH 07-12-15 28.0  26.0 - 34.0 pg Final  . MCHC 2015/07/12 33.0  32.0 - 36.0 g/dL Final  . RDW July 12, 2015 15.6* 11.5 - 14.5 % Final  . Platelets 07-12-15 137* 150 - 440 K/uL Final  . Glucose-Capillary 2015-07-12 105* 65 - 99 mg/dL Final  . Comment 1 07/12/15 Notify RN   Final    Lab Results  Component Value Date   LABCA2 13.3 04/23/2014   No results found for: CA199 Lab Results  Component Value Date   CEA 2.0 11/07/2014   No results found for: PSA No results found for: CA125   STUDIES: Dg Elbow Complete Left  07-12-15  CLINICAL DATA:  Left elbow pain after fall from chair. EXAM: LEFT ELBOW - COMPLETE 3+ VIEW COMPARISON:  None. FINDINGS: No acute fracture or dislocation. No joint effusion. Small olecranon spur. Well corticated ossification adjacent to the lateral humeral condyle consistent with remote prior injury. IMPRESSION: No fracture or dislocation of the left elbow. Electronically Signed   By: Jeb Levering M.D.   On: 2015-07-12 21:27   Dg Wrist Complete Left  July 12, 2015  CLINICAL DATA:  Fall.  Seizure like activity. EXAM: LEFT WRIST - COMPLETE 3+ VIEW COMPARISON:  07-12-2015 FINDINGS: Chronic ulnar styloid fracture identified. No acute fractures or subluxations identified. IMPRESSION: 1. Chronic ulnar styloid fracture. 2. No acute findings. Electronically  Signed   By: Kerby Moors M.D.   On: 2015-07-12 21:33   Ct Head Wo Contrast  July 12, 2015  CLINICAL DATA:  Unresponsive. Seizure and headache. Patient with history of malignancy. EXAM: CT HEAD WITHOUT CONTRAST TECHNIQUE: Contiguous axial images were obtained from  the base of the skull through the vertex without intravenous contrast. COMPARISON:  No prior head imaging. FINDINGS: Multifocal regions of confluent white matter hypodensity, with bifrontal, left temporal, right periventricular, and left occipital involvement. Within the anterior left frontal hypodensity are small punctate hyperdense foci. A similar hyperdense focus is seen in the posterior right frontal lobe. There is no midline shift. Mild generalized atrophy without hydrocephalus. No intracranial fluid collection. Fluid level in the sphenoid sinus. Mastoid air cells are well aerated. Bony calvarium intact. IMPRESSION: Multifocal regions of confluent white matter hypodensity, with differential considerations of edema from brain lesions (metastatic disease) versus ischemia. Internal punctate hyperdensities in the frontal lobes may reflect punctate marker hemorrhages. Brain MRI, preferably with contrast recommended for further characterization. These results were called by telephone at the time of interpretation on 07/07/2015 at 11:37 pm to Dr. Delman Kitten , who verbally acknowledged these results. Electronically Signed   By: Jeb Levering M.D.   On: 07/07/2015 23:43   Dg Shoulder Left  07/07/2015  CLINICAL DATA:  Left shoulder pain after fall. EXAM: LEFT SHOULDER - 2+ VIEW COMPARISON:  None. FINDINGS: No fracture or dislocation. The alignment and joint spaces are maintained. No abnormal soft tissue calcifications. Left chest port is partially included. IMPRESSION: No fracture or dislocation of the left shoulder. Electronically Signed   By: Jeb Levering M.D.   On: 07/07/2015 21:24    ASSESSMENT: 1.  Carcinoma of breast (right) from clinical  examination as well as reviewing CT scan it appears that patient has excellent response.  Will continue maintenance Herceptin therapy Possibility of local therapy is going to be discussed 2.  Carcinoma of stomach.  Pathology report has been reviewed.  Significant response to the chemotherapy .Marland Kitchen MRI scan of brain has been reviewed independently multiple brain metastases. Discussed the situation with Dr. Loney Laurence radiation therapy to the whole brain Gradually decrease Decadron Problem may have to be continued Discussed the need for physiotherapy and occupational therapy and we will refer patient to home health Hold further her Herceptin therapy Hold MUGA scan of the heart at present time Any further systemic therapy will depend on overall patient's progress for metastases to the brain Palliative approach also can be considered after radiation therapy is finished  Breast cancer   Staging form: Breast, AJCC 7th Edition     Clinical: Stage IV (T2, N1, M1) - Signed by Forest Gleason, MD on 10/19/2014 Cancer of body of stomach   Staging form: Stomach, AJCC 7th Edition     Clinical: Stage IIA (T3, N0, M0) - Marni Griffon, MD   07/14/2015 3:23 PM

## 2015-07-15 ENCOUNTER — Encounter: Payer: Self-pay | Admitting: *Deleted

## 2015-07-15 ENCOUNTER — Ambulatory Visit
Admission: RE | Admit: 2015-07-15 | Discharge: 2015-07-15 | Disposition: A | Discharge status: Home / Self Care | Payer: BLUE CROSS/BLUE SHIELD | Source: Ambulatory Visit | Attending: Radiation Oncology | Admitting: Radiation Oncology

## 2015-07-15 DIAGNOSIS — Z87891 Personal history of nicotine dependence: Secondary | ICD-10-CM | POA: Diagnosis not present

## 2015-07-15 DIAGNOSIS — F419 Anxiety disorder, unspecified: Secondary | ICD-10-CM | POA: Diagnosis not present

## 2015-07-15 DIAGNOSIS — Z803 Family history of malignant neoplasm of breast: Secondary | ICD-10-CM | POA: Diagnosis not present

## 2015-07-15 DIAGNOSIS — Z79899 Other long term (current) drug therapy: Secondary | ICD-10-CM | POA: Diagnosis not present

## 2015-07-15 DIAGNOSIS — Z801 Family history of malignant neoplasm of trachea, bronchus and lung: Secondary | ICD-10-CM | POA: Diagnosis not present

## 2015-07-15 DIAGNOSIS — Z800 Family history of malignant neoplasm of digestive organs: Secondary | ICD-10-CM | POA: Diagnosis not present

## 2015-07-15 DIAGNOSIS — C7931 Secondary malignant neoplasm of brain: Secondary | ICD-10-CM | POA: Diagnosis not present

## 2015-07-15 DIAGNOSIS — Z9221 Personal history of antineoplastic chemotherapy: Secondary | ICD-10-CM | POA: Diagnosis not present

## 2015-07-15 DIAGNOSIS — Z170 Estrogen receptor positive status [ER+]: Secondary | ICD-10-CM | POA: Diagnosis not present

## 2015-07-15 DIAGNOSIS — M129 Arthropathy, unspecified: Secondary | ICD-10-CM | POA: Diagnosis not present

## 2015-07-15 DIAGNOSIS — C50911 Malignant neoplasm of unspecified site of right female breast: Secondary | ICD-10-CM | POA: Diagnosis not present

## 2015-07-15 DIAGNOSIS — C169 Malignant neoplasm of stomach, unspecified: Secondary | ICD-10-CM | POA: Diagnosis not present

## 2015-07-15 DIAGNOSIS — K219 Gastro-esophageal reflux disease without esophagitis: Secondary | ICD-10-CM | POA: Diagnosis not present

## 2015-07-15 DIAGNOSIS — Z510 Encounter for antineoplastic radiation therapy: Secondary | ICD-10-CM | POA: Diagnosis not present

## 2015-07-16 DIAGNOSIS — C7931 Secondary malignant neoplasm of brain: Secondary | ICD-10-CM | POA: Diagnosis not present

## 2015-07-18 ENCOUNTER — Other Ambulatory Visit: Payer: Self-pay | Admitting: *Deleted

## 2015-07-21 ENCOUNTER — Ambulatory Visit
Admission: RE | Admit: 2015-07-21 | Discharge: 2015-07-21 | Disposition: A | Discharge status: Home / Self Care | Payer: BLUE CROSS/BLUE SHIELD | Source: Ambulatory Visit | Attending: Radiation Oncology | Admitting: Radiation Oncology

## 2015-07-21 DIAGNOSIS — C7931 Secondary malignant neoplasm of brain: Secondary | ICD-10-CM | POA: Diagnosis not present

## 2015-07-22 ENCOUNTER — Ambulatory Visit
Admission: RE | Admit: 2015-07-22 | Discharge: 2015-07-22 | Disposition: A | Discharge status: Home / Self Care | Payer: BLUE CROSS/BLUE SHIELD | Source: Ambulatory Visit | Attending: Radiation Oncology | Admitting: Radiation Oncology

## 2015-07-22 DIAGNOSIS — C7931 Secondary malignant neoplasm of brain: Secondary | ICD-10-CM | POA: Diagnosis not present

## 2015-07-23 ENCOUNTER — Ambulatory Visit
Admission: RE | Admit: 2015-07-23 | Discharge: 2015-07-23 | Disposition: A | Discharge status: Home / Self Care | Payer: BLUE CROSS/BLUE SHIELD | Source: Ambulatory Visit | Attending: Radiation Oncology | Admitting: Radiation Oncology

## 2015-07-23 DIAGNOSIS — C7931 Secondary malignant neoplasm of brain: Secondary | ICD-10-CM | POA: Diagnosis not present

## 2015-07-24 ENCOUNTER — Ambulatory Visit
Admission: RE | Admit: 2015-07-24 | Discharge: 2015-07-24 | Disposition: A | Discharge status: Home / Self Care | Payer: BLUE CROSS/BLUE SHIELD | Source: Ambulatory Visit | Attending: Radiation Oncology | Admitting: Radiation Oncology

## 2015-07-24 DIAGNOSIS — C7931 Secondary malignant neoplasm of brain: Secondary | ICD-10-CM | POA: Diagnosis not present

## 2015-07-25 ENCOUNTER — Ambulatory Visit
Admission: RE | Admit: 2015-07-25 | Discharge: 2015-07-25 | Disposition: A | Discharge status: Home / Self Care | Payer: BLUE CROSS/BLUE SHIELD | Source: Ambulatory Visit | Attending: Radiation Oncology | Admitting: Radiation Oncology

## 2015-07-25 DIAGNOSIS — C7931 Secondary malignant neoplasm of brain: Secondary | ICD-10-CM | POA: Diagnosis not present

## 2015-07-28 ENCOUNTER — Ambulatory Visit
Admission: RE | Admit: 2015-07-28 | Discharge: 2015-07-28 | Disposition: A | Discharge status: Home / Self Care | Payer: BLUE CROSS/BLUE SHIELD | Source: Ambulatory Visit | Attending: Radiation Oncology | Admitting: Radiation Oncology

## 2015-07-28 DIAGNOSIS — C7931 Secondary malignant neoplasm of brain: Secondary | ICD-10-CM | POA: Diagnosis not present

## 2015-07-29 ENCOUNTER — Other Ambulatory Visit: Payer: BLUE CROSS/BLUE SHIELD

## 2015-07-29 ENCOUNTER — Ambulatory Visit
Admission: RE | Admit: 2015-07-29 | Discharge: 2015-07-29 | Disposition: A | Discharge status: Home / Self Care | Payer: BLUE CROSS/BLUE SHIELD | Source: Ambulatory Visit | Attending: Radiation Oncology | Admitting: Radiation Oncology

## 2015-07-29 ENCOUNTER — Ambulatory Visit: Payer: BLUE CROSS/BLUE SHIELD

## 2015-07-29 ENCOUNTER — Ambulatory Visit: Payer: BLUE CROSS/BLUE SHIELD | Admitting: Oncology

## 2015-07-29 DIAGNOSIS — C7931 Secondary malignant neoplasm of brain: Secondary | ICD-10-CM | POA: Diagnosis not present

## 2015-07-30 ENCOUNTER — Ambulatory Visit
Admission: RE | Admit: 2015-07-30 | Discharge: 2015-07-30 | Disposition: A | Discharge status: Home / Self Care | Payer: BLUE CROSS/BLUE SHIELD | Source: Ambulatory Visit | Attending: Radiation Oncology | Admitting: Radiation Oncology

## 2015-07-30 DIAGNOSIS — C7931 Secondary malignant neoplasm of brain: Secondary | ICD-10-CM | POA: Diagnosis not present

## 2015-07-31 ENCOUNTER — Inpatient Hospital Stay: Payer: BLUE CROSS/BLUE SHIELD

## 2015-07-31 ENCOUNTER — Inpatient Hospital Stay (HOSPITAL_BASED_OUTPATIENT_CLINIC_OR_DEPARTMENT_OTHER): Payer: BLUE CROSS/BLUE SHIELD | Admitting: Oncology

## 2015-07-31 ENCOUNTER — Ambulatory Visit
Admission: RE | Admit: 2015-07-31 | Discharge: 2015-07-31 | Disposition: A | Discharge status: Home / Self Care | Payer: BLUE CROSS/BLUE SHIELD | Source: Ambulatory Visit | Attending: Radiation Oncology | Admitting: Radiation Oncology

## 2015-07-31 VITALS — BP 124/81 | HR 69 | Temp 97.1°F | Resp 18 | Wt 105.0 lb

## 2015-07-31 DIAGNOSIS — Z171 Estrogen receptor negative status [ER-]: Secondary | ICD-10-CM

## 2015-07-31 DIAGNOSIS — Z87891 Personal history of nicotine dependence: Secondary | ICD-10-CM

## 2015-07-31 DIAGNOSIS — C7931 Secondary malignant neoplasm of brain: Secondary | ICD-10-CM | POA: Diagnosis not present

## 2015-07-31 DIAGNOSIS — C169 Malignant neoplasm of stomach, unspecified: Secondary | ICD-10-CM

## 2015-07-31 DIAGNOSIS — Z9011 Acquired absence of right breast and nipple: Secondary | ICD-10-CM

## 2015-07-31 DIAGNOSIS — C50922 Malignant neoplasm of unspecified site of left male breast: Secondary | ICD-10-CM

## 2015-07-31 DIAGNOSIS — Z79899 Other long term (current) drug therapy: Secondary | ICD-10-CM

## 2015-07-31 DIAGNOSIS — C787 Secondary malignant neoplasm of liver and intrahepatic bile duct: Secondary | ICD-10-CM

## 2015-07-31 DIAGNOSIS — C50911 Malignant neoplasm of unspecified site of right female breast: Secondary | ICD-10-CM

## 2015-07-31 DIAGNOSIS — C50411 Malignant neoplasm of upper-outer quadrant of right female breast: Secondary | ICD-10-CM | POA: Diagnosis not present

## 2015-07-31 DIAGNOSIS — C778 Secondary and unspecified malignant neoplasm of lymph nodes of multiple regions: Secondary | ICD-10-CM | POA: Diagnosis not present

## 2015-07-31 DIAGNOSIS — R1013 Epigastric pain: Secondary | ICD-10-CM

## 2015-07-31 DIAGNOSIS — C162 Malignant neoplasm of body of stomach: Secondary | ICD-10-CM

## 2015-07-31 DIAGNOSIS — F419 Anxiety disorder, unspecified: Secondary | ICD-10-CM

## 2015-07-31 DIAGNOSIS — M1990 Unspecified osteoarthritis, unspecified site: Secondary | ICD-10-CM

## 2015-07-31 LAB — CBC WITH DIFFERENTIAL/PLATELET
BASOS ABS: 0 10*3/uL (ref 0–0.1)
BASOS PCT: 0 %
Eosinophils Absolute: 0 10*3/uL (ref 0–0.7)
Eosinophils Relative: 0 %
HEMATOCRIT: 38.7 % (ref 35.0–47.0)
HEMOGLOBIN: 13.1 g/dL (ref 12.0–16.0)
LYMPHS PCT: 6 %
Lymphs Abs: 0.3 10*3/uL — ABNORMAL LOW (ref 1.0–3.6)
MCH: 29.5 pg (ref 26.0–34.0)
MCHC: 33.9 g/dL (ref 32.0–36.0)
MCV: 87.2 fL (ref 80.0–100.0)
MONOS PCT: 2 %
Monocytes Absolute: 0.1 10*3/uL — ABNORMAL LOW (ref 0.2–0.9)
NEUTROS ABS: 5 10*3/uL (ref 1.4–6.5)
NEUTROS PCT: 92 %
Platelets: 133 10*3/uL — ABNORMAL LOW (ref 150–440)
RBC: 4.44 MIL/uL (ref 3.80–5.20)
RDW: 16.6 % — ABNORMAL HIGH (ref 11.5–14.5)
WBC: 5.4 10*3/uL (ref 3.6–11.0)

## 2015-07-31 LAB — COMPREHENSIVE METABOLIC PANEL
ALBUMIN: 2.7 g/dL — AB (ref 3.5–5.0)
ALK PHOS: 36 U/L — AB (ref 38–126)
ALT: 52 U/L (ref 14–54)
AST: 20 U/L (ref 15–41)
Anion gap: 8 (ref 5–15)
BILIRUBIN TOTAL: 0.5 mg/dL (ref 0.3–1.2)
BUN: 21 mg/dL — AB (ref 6–20)
CALCIUM: 7.4 mg/dL — AB (ref 8.9–10.3)
CO2: 22 mmol/L (ref 22–32)
CREATININE: 0.49 mg/dL (ref 0.44–1.00)
Chloride: 96 mmol/L — ABNORMAL LOW (ref 101–111)
GFR calc Af Amer: >60 mL/min (ref >60)
GFR calc non Af Amer: >60 mL/min (ref >60)
GLUCOSE: 163 mg/dL — AB (ref 65–99)
Potassium: 4.1 mmol/L (ref 3.5–5.1)
Sodium: 126 mmol/L — ABNORMAL LOW (ref 135–145)
TOTAL PROTEIN: 5 g/dL — AB (ref 6.5–8.1)

## 2015-07-31 MED ORDER — TRAMADOL HCL 50 MG PO TABS: 50.0000 mg | ORAL_TABLET | ORAL | Status: DC | PRN

## 2015-07-31 NOTE — Progress Notes (Signed)
Patient requesting refill for Tramadol

## 2015-08-01 ENCOUNTER — Ambulatory Visit
Admission: RE | Admit: 2015-08-01 | Discharge: 2015-08-01 | Disposition: A | Discharge status: Home / Self Care | Payer: BLUE CROSS/BLUE SHIELD | Source: Ambulatory Visit | Attending: Radiation Oncology | Admitting: Radiation Oncology

## 2015-08-01 DIAGNOSIS — C7931 Secondary malignant neoplasm of brain: Secondary | ICD-10-CM | POA: Diagnosis not present

## 2015-08-04 ENCOUNTER — Ambulatory Visit: Payer: BLUE CROSS/BLUE SHIELD

## 2015-08-04 ENCOUNTER — Encounter: Payer: Self-pay | Admitting: Oncology

## 2015-08-04 ENCOUNTER — Telehealth: Payer: Self-pay | Admitting: *Deleted

## 2015-08-04 MED ORDER — LEVETIRACETAM 1000 MG PO TABS: 1000.0000 mg | ORAL_TABLET | Freq: Two times a day (BID) | ORAL | Status: DC

## 2015-08-04 NOTE — Progress Notes (Signed)
[place] @ [facility] [address] [phone]) [phone]  [phone]     EMBERLEE SORTINO OB: Jan 31, 1951  MR#: [id number]  FWY#:[id number]  Patient Care Team: Sofie Hartigan, MD as PCP - General (Family Medicine) Dia Crawford III, MD (Surgery) Clent Jacks, RN as Registered Nurse  CHIEF COMPLAINT:  Chief Complaint  Patient presents with  . Cancer of body of stomach  oncology history Chief Complaint/Diagnosis:   1. Carcinoma of breast right upper quadrant  (4 cm tumor mass) 3 positive lymph node on needle biopsy estrogen receptor progesterone receptor negative.  HER-2/neu receptor positiveby IHC 3+ Diagnosis November, 2015, T2 N1 M0 tumor stage II disease 2. Muga SCAN OF THE HEART EJECTION FRACTION 66% 3. Abnormal PET scan suggesting a mass in the stomach, and liver metastases 4. Upper endoscopy given fungating mass biopsies consistent with stomach primary cancer, HER-2/neu by IHC 2+ and by fish is 1.86. 5. Liver biopsies negative for any malignancy. 6. BRCA 2 sequencing deleterious mutation in 2014 ins As mentioned above. 7. ENDOSCOPY ULTRASOUND SHOWS STOMACH TUMOR TO BE t3 n0 m0  1.  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 August , 2016  Mastectomy right breast pT1 pNxMx  estrogen receptor  Negative.  Progesterone receptor negative.  HER-2/neu receptor positive tumor   She   is on maintenance Herceptin therapy since August of 2016 MUGA scan in November of 2016 stable 69%. 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  (February, 2017 Started on radiation therapy from February 8              INTERVAL HISTORY:  65 year old lady who is now working full-time came today further follow-up.  Appetite is slowly improving.  Energy level has improved somewhat.  Hemoglobin is 10 g. Patient does not have any  nausea vomiting has gained 5 pounds of weight. Last MUGA scan was in November reported to be stable.  Here for further follow-up and treatment consideration. Patient complains of pain in epigastric area.  Just dull aching constant.   Patient is starting radiation therapy On Decadron which is being tapered off. Today.  No nausea no vomiting. Limited ambulation REVIEW OF SYSTEMS:   GENERAL:  Feels good.  Active.  No fevers, sweats or weight loss. PERFORMANCE STATUS (ECOG): 01 HEENT:  No visual changes, runny nose, sore throat, mouth sores or tenderness. Lungs: No shortness of breath or cough.  No hemoptysis. Cardiac:  No chest pain, palpitations, orthopnea, or PND. GI:  No nausea, vomiting, diarrhea, constipation, melena or hematochezia. GU:  No urgency, frequency, dysuria, or hematuria. Musculoskeletal:  No back pain.  No joint pain.  No muscle tenderness. Extremities:  No pain or swelling. Skin:  No rashes or skin changes. Neuro:  No headache, numbness or weakness, balance or coordination issues. Endocrine:  No diabetes, thyroid issues, hot flashes or night sweats. Psych:  No mood changes, depression or anxiety. Pain:  No focal pain. Review of systems:  All other systems reviewed and found to be negative.   PAST MEDICAL HISTORY: Past Medical History  Diagnosis Date  . Right breast cancer with malignant cells in regional lymph nodes no greater than 0.2 mm and no more than 200 cells ([place])   . Arthritis   . GERD (gastroesophageal reflux disease)   . Stomach cancer ([place])   . Family history of adverse reaction to anesthesia  mother had nausea  . Glaucoma   . Anxiety   . Brain metastasis ([place])     PAST SURGICAL HISTORY: Past Surgical History  Procedure Laterality Date  . Abdominal hysterectomy    . Portacath placement  05/01/2014    Dr. Pat Patrick  . Gastrectomy N/A 11/11/2014    Procedure: Scarlett Presto I, GASTRODUODENOSTOMY, IOUS OF LIVER ;  Surgeon: Molly Maduro, MD;   Location: [facility] ORS;  Service: General;  Laterality: N/A;  . Simple mastectomy with axillary sentinel node biopsy Right 02/04/2015    Procedure: SIMPLE MASTECTOMY;  Surgeon: Dia Crawford III, MD;  Location: [facility] ORS;  Service: General;  Laterality: Right;    FAMILY HISTORY  Significant History/PMH:   Arthritis:    Glaucoma:    Breast Cancer:    GERD - Esophageal Reflux:    Colonoscopy:    Hysterectomy:   Preventive Screening:  Has patient had any of the following test? Colonscopy  Mammography  Pap Smear (1)   Last Colonoscopy: 2010(1)   Last Mammography: 2015(1)   Last Pap Smear: 2012-hysterectomy in 1970's(1)   Smoking History: Smoking History Cigarettes per day and smoked for 25 years less than a half pack per day; quit 17 years ago(1)  PFSH: Comments: mother had a breast cancer when she was 79 years old.mother sister had breast cancer.  Mother also has stomach cancer  Social History: negative alcohol, negative tobacco  Additional Past Medical and Surgical History: gastroesophageal reflux disease  Arthritis  Hysterectomy.  Ovaries were not removed           ADVANCED DIRECTIVES: Patient does not have any advanced healthcare directive. Information has been given.   HEALTH MAINTENANCE: Social History  Substance Use Topics  . Smoking status: Former Smoker    Quit date: 07/09/1996  . Smokeless tobacco: Never Used  . Alcohol Use: No      Allergies  Allergen Reactions  . Codeine Other (See Comments) and Nausea Only    GI distress    Current Outpatient Prescriptions  Medication Sig Dispense Refill  . acetaminophen (TYLENOL) 500 MG tablet Take 500 mg by mouth every 6 (six) hours as needed for moderate pain or headache.    . ALPRAZolam (XANAX) 0.25 MG tablet Take 1 tablet (0.25 mg total) by mouth 2 (two) times daily at 10 am and 4 pm. 60 tablet 3  . brimonidine-timolol (COMBIGAN) 0.2-0.5 % ophthalmic solution Administer 1 drop into the left eye Two (2) times  a day.    . citalopram (CELEXA) 20 MG tablet Take 1 tablet (20 mg total) by mouth daily. 90 tablet 3  . dexamethasone (DECADRON) 4 MG tablet Take 8 mg by mouth 2 (two) times daily.     Marland Kitchen levETIRAcetam (KEPPRA) 1000 MG tablet Take 1,000 mg by mouth 2 (two) times daily.     . metoCLOPramide (REGLAN) 10 MG tablet Take 1 tablet (10 mg total) by mouth 2 (two) times daily. 60 tablet 3  . Multiple Vitamins-Minerals (MULTIVITAMIN WITH MINERALS) tablet Take 1 tablet by mouth daily.    . multivitamin (GERI-TONIC) LIQD Take 15 mLs by mouth daily.    Marland Kitchen omeprazole (PRILOSEC) 40 MG capsule Take 40 mg by mouth 2 (two) times daily.   2  . ondansetron (ZOFRAN-ODT) 4 MG disintegrating tablet Take 1 tablet (4 mg total) by mouth every 4 (four) hours as needed for nausea. 30 tablet 3  . ranitidine (ZANTAC) 150 MG tablet Take 150 mg by mouth 2 (two) times daily.    Marland Kitchen  traMADol (ULTRAM) 50 MG tablet Take 1 tablet (50 mg total) by mouth every 4 (four) hours as needed (Mild pain). 60 tablet 0   No current facility-administered medications for this visit.   Facility-Administered Medications Ordered in Other Visits  Medication Dose Route Frequency Provider Last Rate Last Dose  . heparin lock flush 100 unit/mL  500 Units Intravenous Once Forest Gleason, MD      . sodium chloride 0.9 % injection 10 mL  10 mL Intracatheter PRN Forest Gleason, MD        OBJECTIVE:  Filed Vitals:   07/31/15 1503  BP: 124/81  Pulse: 69  Temp: 97.1 F (36.2 C)  Resp: 18     Body mass index is 21.2 kg/(m^2).    ECOG FS:1 - Symptomatic but completely ambulatory  PHYSICAL EXAM: Gen. status: Patient is alert oriented not any acute distress slightly apprehensive.  Lymphatic system: No palpable supraclavicular cervical or axillary lymphadenopathy examination of breast: Right breast no palpable mass.  Left breast we have masses. Abdomen: Soft.  Liver and spleen not palpable.  Bowel sounds are present.  Lower extremity no swelling.  limit Cardiac  exam revealed the PMI to be normally situated and sized. The rhythm was regular and no extrasystoles were noted during several minutes of auscultation. The first and second heart sounds were normal and physiologic splitting of the second heart sound was noted. There were no murmurs, rubs, clicks, or gallops. Examination of the chest was unremarkable. There were no bony deformities, no asymmetry, and no other abnormalities. Examination of the skin revealed no evidence of significant rashes, suspicious appearing nevi or other concerning lesions.  Lower extremity no edema  Neurological system: Higher functions patient has dysphagia .Power and tone in upper and lower extremity has uniformly decreased. Cranial nerves are intact LAB RESULTS:  Appointment on 07/31/2015  Component Date Value Ref Range Status  . WBC 07/31/2015 5.4  3.6 - 11.0 K/uL Final  . RBC 07/31/2015 4.44  3.80 - 5.20 MIL/uL Final  . Hemoglobin 07/31/2015 13.1  12.0 - 16.0 g/dL Final  . HCT 07/31/2015 38.7  35.0 - 47.0 % Final  . MCV 07/31/2015 87.2  80.0 - 100.0 fL Final  . MCH 07/31/2015 29.5  26.0 - 34.0 pg Final  . MCHC 07/31/2015 33.9  32.0 - 36.0 g/dL Final  . RDW 07/31/2015 16.6* 11.5 - 14.5 % Final  . Platelets 07/31/2015 133* 150 - 440 K/uL Final  . Neutrophils Relative % 07/31/2015 92   Final  . Neutro Abs 07/31/2015 5.0  1.4 - 6.5 K/uL Final  . Lymphocytes Relative 07/31/2015 6   Final  . Lymphs Abs 07/31/2015 0.3* 1.0 - 3.6 K/uL Final  . Monocytes Relative 07/31/2015 2   Final  . Monocytes Absolute 07/31/2015 0.1* 0.2 - 0.9 K/uL Final  . Eosinophils Relative 07/31/2015 0   Final  . Eosinophils Absolute 07/31/2015 0.0  0 - 0.7 K/uL Final  . Basophils Relative 07/31/2015 0   Final  . Basophils Absolute 07/31/2015 0.0  0 - 0.1 K/uL Final  . Sodium 07/31/2015 126* 135 - 145 mmol/L Final  . Potassium 07/31/2015 4.1  3.5 - 5.1 mmol/L Final  . Chloride 07/31/2015 96* 101 - 111 mmol/L Final  . CO2 07/31/2015 22  22 -  32 mmol/L Final  . Glucose, Bld 07/31/2015 163* 65 - 99 mg/dL Final  . BUN 07/31/2015 21* 6 - 20 mg/dL Final  . Creatinine, Ser 07/31/2015 0.49  0.44 - 1.00 mg/dL Final  .  Calcium 07/31/2015 7.4* 8.9 - 10.3 mg/dL Final  . Total Protein 07/31/2015 5.0* 6.5 - 8.1 g/dL Final  . Albumin 07/31/2015 2.7* 3.5 - 5.0 g/dL Final  . AST 07/31/2015 20  15 - 41 U/L Final  . ALT 07/31/2015 52  14 - 54 U/L Final  . Alkaline Phosphatase 07/31/2015 36* 38 - 126 U/L Final  . Total Bilirubin 07/31/2015 0.5  0.3 - 1.2 mg/dL Final  . GFR calc non Af Amer 07/31/2015 >60  >60 mL/min Final  . GFR calc Af Amer 07/31/2015 >60  >60 mL/min Final   Comment: (NOTE) The eGFR has been calculated using the CKD EPI equation. This calculation has not been validated in all clinical situations. eGFR's persistently <60 mL/min signify possible Chronic Kidney Disease.   . Anion gap 07/31/2015 8  5 - 15 Final        STUDIES: Dg Elbow Complete Left  Jul 26, 2015  CLINICAL DATA:  Left elbow pain after fall from chair. EXAM: LEFT ELBOW - COMPLETE 3+ VIEW COMPARISON:  None. FINDINGS: No acute fracture or dislocation. No joint effusion. Small olecranon spur. Well corticated ossification adjacent to the lateral humeral condyle consistent with remote prior injury. IMPRESSION: No fracture or dislocation of the left elbow. Electronically Signed   By: Jeb Levering M.D.   On: 2015-07-26 21:27   Dg Wrist Complete Left  26-Jul-2015  CLINICAL DATA:  Fall.  Seizure like activity. EXAM: LEFT WRIST - COMPLETE 3+ VIEW COMPARISON:  07-26-2015 FINDINGS: Chronic ulnar styloid fracture identified. No acute fractures or subluxations identified. IMPRESSION: 1. Chronic ulnar styloid fracture. 2. No acute findings. Electronically Signed   By: Kerby Moors M.D.   On: 07/26/2015 21:33   Ct Head Wo Contrast  Jul 26, 2015  CLINICAL DATA:  Unresponsive. Seizure and headache. Patient with history of malignancy. EXAM: CT HEAD WITHOUT CONTRAST TECHNIQUE:  Contiguous axial images were obtained from the base of the skull through the vertex without intravenous contrast. COMPARISON:  No prior head imaging. FINDINGS: Multifocal regions of confluent white matter hypodensity, with bifrontal, left temporal, right periventricular, and left occipital involvement. Within the anterior left frontal hypodensity are small punctate hyperdense foci. A similar hyperdense focus is seen in the posterior right frontal lobe. There is no midline shift. Mild generalized atrophy without hydrocephalus. No intracranial fluid collection. Fluid level in the sphenoid sinus. Mastoid air cells are well aerated. Bony calvarium intact. IMPRESSION: Multifocal regions of confluent white matter hypodensity, with differential considerations of edema from brain lesions (metastatic disease) versus ischemia. Internal punctate hyperdensities in the frontal lobes may reflect punctate marker hemorrhages. Brain MRI, preferably with contrast recommended for further characterization. These results were called by telephone at the time of interpretation on 07-26-15 at 11:37 pm to Dr. Delman Kitten , who verbally acknowledged these results. Electronically Signed   By: Jeb Levering M.D.   On: Jul 26, 2015 23:43   Dg Shoulder Left  07-26-2015  CLINICAL DATA:  Left shoulder pain after fall. EXAM: LEFT SHOULDER - 2+ VIEW COMPARISON:  None. FINDINGS: No fracture or dislocation. The alignment and joint spaces are maintained. No abnormal soft tissue calcifications. Left chest port is partially included. IMPRESSION: No fracture or dislocation of the left shoulder. Electronically Signed   By: Jeb Levering M.D.   On: 07-26-15 21:24    ASSESSMENT: 1.  Carcinoma of breast (right) from clinical examination as well as reviewing CT scan it appears that patient has excellent response.  Will continue maintenance Herceptin therapy Possibility of local therapy  is going to be discussed 2.  Carcinoma of stomach.   Pathology report has been reviewed.  Significant response to the chemotherapy .Marland Kitchen MRI scan of brain has been reviewed independently multiple brain metastases. Discussed the situation with Dr. Loney Laurence radiation therapy to the whole brain Gradually decrease Decadron Problem may have to be continued Discussed the need for physiotherapy and occupational therapy and we will refer patient to home health Hold further her Herceptin therapy Hold MUGA scan of the heart at present time Any further systemic therapy will depend on overall patient's progress for metastases to the brain Palliative approach also can be considered after radiation therapy is finished  will taper off her Decadron. Patient is getting physiotherapy by home health department  Breast cancer   Staging form: Breast, AJCC 7th Edition     Clinical: Stage IV (T2, N1, M1) - Signed by Forest Gleason, MD on 10/19/2014 Cancer of body of stomach   Staging form: Stomach, AJCC 7th Edition     Clinical: Stage IIA (T3, N0, M0) - Marni Griffon, MD   08/04/2015 8:28 AM

## 2015-08-04 NOTE — Telephone Encounter (Signed)
Escribed

## 2015-08-05 ENCOUNTER — Ambulatory Visit
Admission: RE | Admit: 2015-08-05 | Discharge: 2015-08-05 | Disposition: A | Discharge status: Home / Self Care | Payer: BLUE CROSS/BLUE SHIELD | Source: Ambulatory Visit | Attending: Radiation Oncology | Admitting: Radiation Oncology

## 2015-08-05 DIAGNOSIS — C7931 Secondary malignant neoplasm of brain: Secondary | ICD-10-CM | POA: Diagnosis not present

## 2015-08-06 ENCOUNTER — Ambulatory Visit
Admission: RE | Admit: 2015-08-06 | Discharge: 2015-08-06 | Disposition: A | Discharge status: Home / Self Care | Payer: BLUE CROSS/BLUE SHIELD | Source: Ambulatory Visit | Attending: Radiation Oncology | Admitting: Radiation Oncology

## 2015-08-06 DIAGNOSIS — C7931 Secondary malignant neoplasm of brain: Secondary | ICD-10-CM | POA: Diagnosis not present

## 2015-08-07 ENCOUNTER — Ambulatory Visit
Admission: RE | Admit: 2015-08-07 | Discharge: 2015-08-07 | Disposition: A | Discharge status: Home / Self Care | Payer: BLUE CROSS/BLUE SHIELD | Source: Ambulatory Visit | Attending: Radiation Oncology | Admitting: Radiation Oncology

## 2015-08-07 DIAGNOSIS — C7931 Secondary malignant neoplasm of brain: Secondary | ICD-10-CM | POA: Diagnosis not present

## 2015-08-11 ENCOUNTER — Other Ambulatory Visit: Payer: Self-pay | Admitting: Oncology

## 2015-08-14 ENCOUNTER — Inpatient Hospital Stay (HOSPITAL_BASED_OUTPATIENT_CLINIC_OR_DEPARTMENT_OTHER): Payer: BLUE CROSS/BLUE SHIELD | Admitting: Oncology

## 2015-08-14 ENCOUNTER — Inpatient Hospital Stay: Payer: BLUE CROSS/BLUE SHIELD | Attending: Oncology

## 2015-08-14 VITALS — BP 111/80 | HR 87 | Temp 97.7°F | Resp 18 | Wt 90.2 lb

## 2015-08-14 DIAGNOSIS — C787 Secondary malignant neoplasm of liver and intrahepatic bile duct: Secondary | ICD-10-CM | POA: Insufficient documentation

## 2015-08-14 DIAGNOSIS — Z923 Personal history of irradiation: Secondary | ICD-10-CM | POA: Insufficient documentation

## 2015-08-14 DIAGNOSIS — C169 Malignant neoplasm of stomach, unspecified: Secondary | ICD-10-CM

## 2015-08-14 DIAGNOSIS — F419 Anxiety disorder, unspecified: Secondary | ICD-10-CM | POA: Insufficient documentation

## 2015-08-14 DIAGNOSIS — C7931 Secondary malignant neoplasm of brain: Secondary | ICD-10-CM

## 2015-08-14 DIAGNOSIS — M1990 Unspecified osteoarthritis, unspecified site: Secondary | ICD-10-CM | POA: Insufficient documentation

## 2015-08-14 DIAGNOSIS — C50411 Malignant neoplasm of upper-outer quadrant of right female breast: Secondary | ICD-10-CM | POA: Diagnosis not present

## 2015-08-14 DIAGNOSIS — Z87891 Personal history of nicotine dependence: Secondary | ICD-10-CM | POA: Insufficient documentation

## 2015-08-14 DIAGNOSIS — Z9011 Acquired absence of right breast and nipple: Secondary | ICD-10-CM | POA: Insufficient documentation

## 2015-08-14 DIAGNOSIS — Z800 Family history of malignant neoplasm of digestive organs: Secondary | ICD-10-CM | POA: Diagnosis not present

## 2015-08-14 DIAGNOSIS — Z803 Family history of malignant neoplasm of breast: Secondary | ICD-10-CM | POA: Insufficient documentation

## 2015-08-14 DIAGNOSIS — C778 Secondary and unspecified malignant neoplasm of lymph nodes of multiple regions: Secondary | ICD-10-CM | POA: Diagnosis not present

## 2015-08-14 DIAGNOSIS — Z79899 Other long term (current) drug therapy: Secondary | ICD-10-CM | POA: Diagnosis not present

## 2015-08-14 DIAGNOSIS — C162 Malignant neoplasm of body of stomach: Secondary | ICD-10-CM

## 2015-08-14 DIAGNOSIS — C50922 Malignant neoplasm of unspecified site of left male breast: Secondary | ICD-10-CM

## 2015-08-14 DIAGNOSIS — K219 Gastro-esophageal reflux disease without esophagitis: Secondary | ICD-10-CM | POA: Diagnosis not present

## 2015-08-14 DIAGNOSIS — Z171 Estrogen receptor negative status [ER-]: Secondary | ICD-10-CM | POA: Diagnosis not present

## 2015-08-14 DIAGNOSIS — R630 Anorexia: Secondary | ICD-10-CM

## 2015-08-14 DIAGNOSIS — C50911 Malignant neoplasm of unspecified site of right female breast: Secondary | ICD-10-CM

## 2015-08-14 LAB — CBC WITH DIFFERENTIAL/PLATELET
Basophils Absolute: 0 10*3/uL (ref 0–0.1)
Basophils Relative: 0 %
Eosinophils Absolute: 0 10*3/uL (ref 0–0.7)
Eosinophils Relative: 0 %
HCT: 36.5 % (ref 35.0–47.0)
HEMOGLOBIN: 12.4 g/dL (ref 12.0–16.0)
Lymphocytes Relative: 29 %
Lymphs Abs: 0.7 10*3/uL — ABNORMAL LOW (ref 1.0–3.6)
MCH: 30 pg (ref 26.0–34.0)
MCHC: 34.1 g/dL (ref 32.0–36.0)
MCV: 87.9 fL (ref 80.0–100.0)
Monocytes Absolute: 0.1 10*3/uL — ABNORMAL LOW (ref 0.2–0.9)
Monocytes Relative: 5 %
NEUTROS ABS: 1.5 10*3/uL (ref 1.4–6.5)
NEUTROS PCT: 66 %
Platelets: 73 10*3/uL — ABNORMAL LOW (ref 150–440)
RBC: 4.16 MIL/uL (ref 3.80–5.20)
RDW: 16.9 % — ABNORMAL HIGH (ref 11.5–14.5)
WBC: 2.3 10*3/uL — AB (ref 3.6–11.0)

## 2015-08-14 LAB — COMPREHENSIVE METABOLIC PANEL
ALK PHOS: 53 U/L (ref 38–126)
ALT: 145 U/L — AB (ref 14–54)
AST: 50 U/L — ABNORMAL HIGH (ref 15–41)
Albumin: 2.7 g/dL — ABNORMAL LOW (ref 3.5–5.0)
Anion gap: 5 (ref 5–15)
BUN: 29 mg/dL — ABNORMAL HIGH (ref 6–20)
CALCIUM: 7.5 mg/dL — AB (ref 8.9–10.3)
CO2: 26 mmol/L (ref 22–32)
CREATININE: 0.49 mg/dL (ref 0.44–1.00)
Chloride: 99 mmol/L — ABNORMAL LOW (ref 101–111)
Glucose, Bld: 104 mg/dL — ABNORMAL HIGH (ref 65–99)
Potassium: 3.9 mmol/L (ref 3.5–5.1)
Sodium: 130 mmol/L — ABNORMAL LOW (ref 135–145)
Total Bilirubin: 0.6 mg/dL (ref 0.3–1.2)
Total Protein: 5.2 g/dL — ABNORMAL LOW (ref 6.5–8.1)

## 2015-08-14 LAB — MAGNESIUM: MAGNESIUM: 1.9 mg/dL (ref 1.7–2.4)

## 2015-08-14 MED ORDER — DEXAMETHASONE 4 MG PO TABS: 2.0000 mg | ORAL_TABLET | Freq: Every day | ORAL | Status: DC

## 2015-08-15 ENCOUNTER — Encounter: Payer: Self-pay | Admitting: Oncology

## 2015-08-15 ENCOUNTER — Telehealth: Payer: Self-pay | Admitting: *Deleted

## 2015-08-15 NOTE — Progress Notes (Signed)
[place] @ [facility] [phone]  Fax:([phone]     Kimberly Thornton OB: [birth year]  MR#: [id number]  TDD#:[id number]  Patient Care Team: Sofie Hartigan, MD as PCP - General (Family Medicine) Dia Crawford III, MD (Surgery) Clent Jacks, RN as Registered Nurse  CHIEF COMPLAINT:  Chief Complaint  Patient presents with  . Breast Cancer  oncology history Chief Complaint/Diagnosis:   1. Carcinoma of breast right upper quadrant  (4 cm tumor mass) 3 positive lymph node on needle biopsy estrogen receptor progesterone receptor negative.  HER-2/neu receptor positiveby IHC 3+ Diagnosis November, 2015, T2 N1 M0 tumor stage II disease 2. Muga SCAN OF THE HEART EJECTION FRACTION 66% 3. Abnormal PET scan suggesting a mass in the stomach, and liver metastases 4. Upper endoscopy given fungating mass biopsies consistent with stomach primary cancer, HER-2/neu by IHC 2+ and by fish is 1.86. 5. Liver biopsies negative for any malignancy. 6. BRCA 2 sequencing deleterious mutation in 2014 ins As mentioned above. 7. ENDOSCOPY ULTRASOUND SHOWS STOMACH TUMOR TO BE t3 n0 m0  1.  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 August , 2016  Mastectomy right breast pT1 pNxMx  estrogen receptor  Negative.  Progesterone receptor negative.  HER-2/neu receptor positive tumor   She   is on maintenance Herceptin therapy since August of 2016 MUGA scan in November of 2016 stable 69%. 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  (February, 2017 Started on radiation therapy from February 8 5.Patient has finished palliative radiation therapy in March of 2017              INTERVAL HISTORY:  65 year old lady who is now working full-time came today further follow-up.    SHE has finished radiation therapy for multiple brain  metastases.  Patient came off steroid 2 days ago.  Patient's general condition is declining.  SHE IS NON COMMUNICATIVE. REVIEW OF SYSTEMS:   Gen. status: Patient is in wheelchair with close eyes and not communicating HEENT: Patient does not follow any commands.  No abnormality detected Rest of the complaints have been up obtained from the family According to the daughter who is primary care giver patient does not talk.  Does not ambulate. Poor appetite. No pain Declining performance status Does not participate in any social activity Most the time sitting at one place with close eyes Gen. declined after patient has been taken off steroid NO SEIZURE activity noticed. Multiple other systems have been reviewed and no abnormality detected   PAST MEDICAL HISTORY: Past Medical History  Diagnosis Date  . Right breast cancer with malignant cells in regional lymph nodes no greater than 0.2 mm and no more than 200 cells ([place])   . Arthritis   . GERD (gastroesophageal reflux disease)   . Stomach cancer ([place])   . Family history of adverse reaction to anesthesia     mother had nausea  . Glaucoma   . Anxiety   . Brain metastasis ([place])     PAST SURGICAL HISTORY: Past Surgical History  Procedure Laterality Date  . Abdominal hysterectomy    . Portacath placement  05/01/2014    Dr. Pat Patrick  . Gastrectomy N/A 11/11/2014    Procedure: Scarlett Presto I, GASTRODUODENOSTOMY, IOUS OF LIVER ;  Surgeon: Molly Maduro, MD;  Location: [facility] ORS;  Service: General;  Laterality: N/A;  . Simple mastectomy with axillary  sentinel node biopsy Right 02/04/2015    Procedure: SIMPLE MASTECTOMY;  Surgeon: Dia Crawford III, MD;  Location: [facility] ORS;  Service: General;  Laterality: Right;    FAMILY HISTORY  Significant History/PMH:   Arthritis:    Glaucoma:    Breast Cancer:    GERD - Esophageal Reflux:    Colonoscopy:    Hysterectomy:   Preventive Screening:  Has patient had any of the following  test? Colonscopy  Mammography  Pap Smear (1)   Last Colonoscopy: 2010(1)   Last Mammography: 2015(1)   Last Pap Smear: 2012-hysterectomy in 1970's(1)   Smoking History: Smoking History Cigarettes per day and smoked for 25 years less than a half pack per day; quit 17 years ago(1)  PFSH: Comments: mother had a breast cancer when she was 27 years old.mother sister had breast cancer.  Mother also has stomach cancer  Social History: negative alcohol, negative tobacco  Additional Past Medical and Surgical History: gastroesophageal reflux disease  Arthritis  Hysterectomy.  Ovaries were not removed           ADVANCED DIRECTIVES: Patient does not have any advanced healthcare directive. Information has been given.   HEALTH MAINTENANCE: Social History  Substance Use Topics  . Smoking status: Former Smoker    Quit date: 07/09/1996  . Smokeless tobacco: Never Used  . Alcohol Use: No      Allergies  Allergen Reactions  . Codeine Other (See Comments) and Nausea Only    GI distress    Current Outpatient Prescriptions  Medication Sig Dispense Refill  . acetaminophen (TYLENOL) 500 MG tablet Take 500 mg by mouth every 6 (six) hours as needed for moderate pain or headache.    . ALPRAZolam (XANAX) 0.25 MG tablet Take 1 tablet (0.25 mg total) by mouth 2 (two) times daily at 10 am and 4 pm. 60 tablet 3  . brimonidine-timolol (COMBIGAN) 0.2-0.5 % ophthalmic solution Administer 1 drop into the left eye Two (2) times a day.    . citalopram (CELEXA) 20 MG tablet Take 1 tablet (20 mg total) by mouth daily. 90 tablet 3  . levETIRAcetam (KEPPRA) 1000 MG tablet Take 1 tablet (1,000 mg total) by mouth 2 (two) times daily. 60 tablet 2  . metoCLOPramide (REGLAN) 10 MG tablet TAKE 1 TABLET (10 MG TOTAL) BY MOUTH 2 (TWO) TIMES DAILY. 60 tablet 3  . Multiple Vitamins-Minerals (MULTIVITAMIN WITH MINERALS) tablet Take 1 tablet by mouth daily.    . multivitamin (GERI-TONIC) LIQD Take 15 mLs by mouth  daily.    Marland Kitchen omeprazole (PRILOSEC) 40 MG capsule Take 40 mg by mouth 2 (two) times daily.   2  . ondansetron (ZOFRAN-ODT) 4 MG disintegrating tablet Take 1 tablet (4 mg total) by mouth every 4 (four) hours as needed for nausea. 30 tablet 3  . ranitidine (ZANTAC) 150 MG tablet Take 150 mg by mouth 2 (two) times daily.    . traMADol (ULTRAM) 50 MG tablet Take 1 tablet (50 mg total) by mouth every 4 (four) hours as needed (Mild pain). 60 tablet 0  . dexamethasone (DECADRON) 4 MG tablet Take 0.5 tablets (2 mg total) by mouth daily. Reported on 08/14/2015     No current facility-administered medications for this visit.   Facility-Administered Medications Ordered in Other Visits  Medication Dose Route Frequency Provider Last Rate Last Dose  . heparin lock flush 100 unit/mL  500 Units Intravenous Once Forest Gleason, MD      . sodium chloride 0.9 % injection 10  mL  10 mL Intracatheter PRN Forest Gleason, MD        OBJECTIVE:  Filed Vitals:   08/14/15 1453  BP: 111/80  Pulse: 87  Temp: 97.7 F (36.5 C)  Resp: 18     Body mass index is 18.21 kg/(m^2).    ECOG FS:1 - Symptomatic but completely ambulatory  PHYSICAL EXAM: Gen. status: Patient is not well oriented.  Alert but does not respond to the amount HEENT: No evidence of stomatitis Lungs: Air entry on both sides dictation no rales Cardiac: Tachycardia GI: Abdomen is soft.  Liver and spleen not palpable no ascites noted tenderness Skin: No rash Neurological system higher functions patient does not want to command.  But opens her eyes  No other obvious focal sign but complete examination is difficult to perform.  Lower extremity trace edema All other systems have been examined LAB RESULTS:  Appointment on 08/14/2015  Component Date Value Ref Range Status  . WBC 08/14/2015 2.3* 3.6 - 11.0 K/uL Final  . RBC 08/14/2015 4.16  3.80 - 5.20 MIL/uL Final  . Hemoglobin 08/14/2015 12.4  12.0 - 16.0 g/dL Final  . HCT 08/14/2015 36.5  35.0 - 47.0 %  Final  . MCV 08/14/2015 87.9  80.0 - 100.0 fL Final  . MCH 08/14/2015 30.0  26.0 - 34.0 pg Final  . MCHC 08/14/2015 34.1  32.0 - 36.0 g/dL Final  . RDW 08/14/2015 16.9* 11.5 - 14.5 % Final  . Platelets 08/14/2015 73* 150 - 440 K/uL Final  . Neutrophils Relative % 08/14/2015 66   Final  . Neutro Abs 08/14/2015 1.5  1.4 - 6.5 K/uL Final  . Lymphocytes Relative 08/14/2015 29   Final  . Lymphs Abs 08/14/2015 0.7* 1.0 - 3.6 K/uL Final  . Monocytes Relative 08/14/2015 5   Final  . Monocytes Absolute 08/14/2015 0.1* 0.2 - 0.9 K/uL Final  . Eosinophils Relative 08/14/2015 0   Final  . Eosinophils Absolute 08/14/2015 0.0  0 - 0.7 K/uL Final  . Basophils Relative 08/14/2015 0   Final  . Basophils Absolute 08/14/2015 0.0  0 - 0.1 K/uL Final  . Sodium 08/14/2015 130* 135 - 145 mmol/L Final  . Potassium 08/14/2015 3.9  3.5 - 5.1 mmol/L Final  . Chloride 08/14/2015 99* 101 - 111 mmol/L Final  . CO2 08/14/2015 26  22 - 32 mmol/L Final  . Glucose, Bld 08/14/2015 104* 65 - 99 mg/dL Final  . BUN 08/14/2015 29* 6 - 20 mg/dL Final  . Creatinine, Ser 08/14/2015 0.49  0.44 - 1.00 mg/dL Final  . Calcium 08/14/2015 7.5* 8.9 - 10.3 mg/dL Final  . Total Protein 08/14/2015 5.2* 6.5 - 8.1 g/dL Final  . Albumin 08/14/2015 2.7* 3.5 - 5.0 g/dL Final  . AST 08/14/2015 50* 15 - 41 U/L Final  . ALT 08/14/2015 145* 14 - 54 U/L Final  . Alkaline Phosphatase 08/14/2015 53  38 - 126 U/L Final  . Total Bilirubin 08/14/2015 0.6  0.3 - 1.2 mg/dL Final  . GFR calc non Af Amer 08/14/2015 >60  >60 mL/min Final  . GFR calc Af Amer 08/14/2015 >60  >60 mL/min Final   Comment: (NOTE) The eGFR has been calculated using the CKD EPI equation. This calculation has not been validated in all clinical situations. eGFR's persistently <60 mL/min signify possible Chronic Kidney Disease.   . Anion gap 08/14/2015 5  5 - 15 Final  . Magnesium 08/14/2015 1.9  1.7 - 2.4 mg/dL Final  STUDIES: No results  found.  ASSESSMENT: 1.  Carcinoma of breast (right) from clinical examination as well as reviewing CT scan it appears that patient has excellent response.  Will continue maintenance Herceptin therapy Possibility of local therapy is going to be discussed 2.  Carcinoma of stomach.  Pathology report has been reviewed.  Significant response to the chemotherapy .Marland Kitchen Multiple brain metastases status post radiation therapy No significant improvement in patient's general condition I had a prolonged discussion with patient's daughter regarding overall prognosis which is very poor Patient is not a candidate for any further chemotherapy Palliative therapy with steroid can be started again We also discussed possibility of hospice and palliative care which DAUGHTER IS agreeable but would like to discuss with the rest of the family before decision is made.  Also I discussed the issue about the do not resuscitation  Total duration of visit was 30   minutes.  50% or more time was spent in counseling patient and family regarding prognosis and options of treatment and available resources  Breast cancer   Staging form: Breast, AJCC 7th Edition     Clinical: Stage IV (T2, N1, M1) - Signed by Forest Gleason, MD on 10/19/2014 Cancer of body of stomach   Staging form: Stomach, AJCC 7th Edition     Clinical: Stage IIA (T3, N0, M0) - Marni Griffon, MD   08/15/2015 12:35 PM

## 2015-08-15 NOTE — Telephone Encounter (Signed)
Pt may be referred to hospice if patient and family are in agreement. Will wait to see if pt gets referred to hospice and then hospice can provide wheelchair if necessary.

## 2015-08-15 NOTE — Telephone Encounter (Signed)
Kimberly Thornton has additional questions regarding order for transport chair.  Please call back.

## 2015-08-22 ENCOUNTER — Other Ambulatory Visit: Payer: Self-pay | Admitting: *Deleted

## 2015-08-22 ENCOUNTER — Telehealth: Payer: Self-pay | Admitting: *Deleted

## 2015-08-22 DIAGNOSIS — C50411 Malignant neoplasm of upper-outer quadrant of right female breast: Secondary | ICD-10-CM | POA: Diagnosis not present

## 2015-08-22 DIAGNOSIS — R300 Dysuria: Secondary | ICD-10-CM

## 2015-08-22 DIAGNOSIS — C7931 Secondary malignant neoplasm of brain: Secondary | ICD-10-CM

## 2015-08-22 DIAGNOSIS — R350 Frequency of micturition: Secondary | ICD-10-CM

## 2015-08-22 LAB — URINALYSIS COMPLETE WITH MICROSCOPIC (ARMC ONLY)
BILIRUBIN URINE: NEGATIVE
Bacteria, UA: NONE SEEN
GLUCOSE, UA: NEGATIVE mg/dL
Hgb urine dipstick: NEGATIVE
KETONES UR: NEGATIVE mg/dL
Leukocytes, UA: NEGATIVE
Nitrite: NEGATIVE
Protein, ur: NEGATIVE mg/dL
SQUAMOUS EPITHELIAL / LPF: NONE SEEN
Specific Gravity, Urine: 1.023 (ref 1.005–1.030)
pH: 6 (ref 5.0–8.0)

## 2015-08-22 NOTE — Telephone Encounter (Signed)
Pe rL Herring, AGNP-C need UA, C&S. Call returned to [place], got VM and left message to return my call

## 2015-08-22 NOTE — Telephone Encounter (Signed)
I spoke with Otila Kluver and she will come and pick up a cup and get specimen and bring it back to [place] today

## 2015-08-22 NOTE — Telephone Encounter (Signed)
Keeps going to bathroom like she has to urinate, but does not do anything. Wandering if she has an infection. Hard to determine if she has pain because of the brain tumor. What to do?

## 2015-08-24 LAB — URINE CULTURE

## 2015-08-31 ENCOUNTER — Other Ambulatory Visit: Payer: Self-pay | Admitting: Oncology

## 2015-09-03 ENCOUNTER — Other Ambulatory Visit: Payer: Self-pay | Admitting: Oncology

## 2015-09-11 ENCOUNTER — Ambulatory Visit: Payer: BLUE CROSS/BLUE SHIELD | Admitting: Radiation Oncology

## 2015-09-11 ENCOUNTER — Inpatient Hospital Stay
Admission: RE | Admit: 2015-09-11 | Payer: BLUE CROSS/BLUE SHIELD | Source: Ambulatory Visit | Admitting: Radiation Oncology

## 2015-09-15 ENCOUNTER — Telehealth: Payer: Self-pay | Admitting: *Deleted

## 2015-09-15 MED ORDER — ZOLPIDEM TARTRATE 5 MG PO TABS: 5.0000 mg | ORAL_TABLET | Freq: Every evening | ORAL | Status: DC | PRN

## 2015-09-15 NOTE — Telephone Encounter (Signed)
Per Dr Grayland Ormond, the steroids are causing

## 2015-09-15 NOTE — Telephone Encounter (Signed)
Ambien called in per VO Dr Grayland Ormond. Otila Kluver informed

## 2015-09-15 NOTE — Telephone Encounter (Signed)
States patient is irritable and unable to sleep, she also has brain mets, asking if we can order something to help her sleep

## 2015-09-23 ENCOUNTER — Inpatient Hospital Stay: Payer: BLUE CROSS/BLUE SHIELD | Attending: Oncology | Admitting: Family Medicine

## 2015-09-23 ENCOUNTER — Inpatient Hospital Stay: Payer: BLUE CROSS/BLUE SHIELD

## 2015-09-23 DIAGNOSIS — Z800 Family history of malignant neoplasm of digestive organs: Secondary | ICD-10-CM | POA: Diagnosis not present

## 2015-09-23 DIAGNOSIS — Z803 Family history of malignant neoplasm of breast: Secondary | ICD-10-CM | POA: Diagnosis not present

## 2015-09-23 DIAGNOSIS — M1990 Unspecified osteoarthritis, unspecified site: Secondary | ICD-10-CM | POA: Insufficient documentation

## 2015-09-23 DIAGNOSIS — C50411 Malignant neoplasm of upper-outer quadrant of right female breast: Secondary | ICD-10-CM | POA: Insufficient documentation

## 2015-09-23 DIAGNOSIS — Z79899 Other long term (current) drug therapy: Secondary | ICD-10-CM | POA: Insufficient documentation

## 2015-09-23 DIAGNOSIS — G4700 Insomnia, unspecified: Secondary | ICD-10-CM | POA: Diagnosis not present

## 2015-09-23 DIAGNOSIS — C169 Malignant neoplasm of stomach, unspecified: Secondary | ICD-10-CM | POA: Insufficient documentation

## 2015-09-23 DIAGNOSIS — R5383 Other fatigue: Secondary | ICD-10-CM | POA: Insufficient documentation

## 2015-09-23 DIAGNOSIS — C778 Secondary and unspecified malignant neoplasm of lymph nodes of multiple regions: Secondary | ICD-10-CM

## 2015-09-23 DIAGNOSIS — Z87891 Personal history of nicotine dependence: Secondary | ICD-10-CM | POA: Diagnosis not present

## 2015-09-23 DIAGNOSIS — Z9011 Acquired absence of right breast and nipple: Secondary | ICD-10-CM | POA: Insufficient documentation

## 2015-09-23 DIAGNOSIS — C7931 Secondary malignant neoplasm of brain: Secondary | ICD-10-CM | POA: Diagnosis not present

## 2015-09-23 DIAGNOSIS — Z171 Estrogen receptor negative status [ER-]: Secondary | ICD-10-CM | POA: Diagnosis not present

## 2015-09-23 DIAGNOSIS — C50911 Malignant neoplasm of unspecified site of right female breast: Secondary | ICD-10-CM

## 2015-09-23 DIAGNOSIS — Z923 Personal history of irradiation: Secondary | ICD-10-CM | POA: Diagnosis not present

## 2015-09-23 DIAGNOSIS — C787 Secondary malignant neoplasm of liver and intrahepatic bile duct: Secondary | ICD-10-CM | POA: Diagnosis not present

## 2015-09-23 DIAGNOSIS — R531 Weakness: Secondary | ICD-10-CM | POA: Diagnosis not present

## 2015-09-23 DIAGNOSIS — K219 Gastro-esophageal reflux disease without esophagitis: Secondary | ICD-10-CM | POA: Insufficient documentation

## 2015-09-23 DIAGNOSIS — R5381 Other malaise: Secondary | ICD-10-CM | POA: Insufficient documentation

## 2015-09-23 DIAGNOSIS — Z801 Family history of malignant neoplasm of trachea, bronchus and lung: Secondary | ICD-10-CM | POA: Insufficient documentation

## 2015-09-23 DIAGNOSIS — C50922 Malignant neoplasm of unspecified site of left male breast: Secondary | ICD-10-CM

## 2015-09-23 LAB — CBC WITH DIFFERENTIAL/PLATELET
BASOS ABS: 0 10*3/uL (ref 0–0.1)
BASOS PCT: 0 %
Eosinophils Absolute: 0 10*3/uL (ref 0–0.7)
Eosinophils Relative: 0 %
HEMATOCRIT: 37 % (ref 35.0–47.0)
Hemoglobin: 12.7 g/dL (ref 12.0–16.0)
Lymphocytes Relative: 16 %
Lymphs Abs: 1.2 10*3/uL (ref 1.0–3.6)
MCH: 31.2 pg (ref 26.0–34.0)
MCHC: 34.4 g/dL (ref 32.0–36.0)
MCV: 90.8 fL (ref 80.0–100.0)
MONO ABS: 0.3 10*3/uL (ref 0.2–0.9)
Monocytes Relative: 4 %
NEUTROS ABS: 6.1 10*3/uL (ref 1.4–6.5)
Neutrophils Relative %: 80 %
PLATELETS: 246 10*3/uL (ref 150–440)
RBC: 4.07 MIL/uL (ref 3.80–5.20)
RDW: 18.2 % — AB (ref 11.5–14.5)
WBC: 7.6 10*3/uL (ref 3.6–11.0)

## 2015-09-23 LAB — COMPREHENSIVE METABOLIC PANEL
ALBUMIN: 3.4 g/dL — AB (ref 3.5–5.0)
ALT: 19 U/L (ref 14–54)
ANION GAP: 8 (ref 5–15)
AST: 31 U/L (ref 15–41)
Alkaline Phosphatase: 55 U/L (ref 38–126)
BILIRUBIN TOTAL: 0.4 mg/dL (ref 0.3–1.2)
BUN: 19 mg/dL (ref 6–20)
CHLORIDE: 102 mmol/L (ref 101–111)
CO2: 21 mmol/L — AB (ref 22–32)
Calcium: 8.4 mg/dL — ABNORMAL LOW (ref 8.9–10.3)
Creatinine, Ser: 0.62 mg/dL (ref 0.44–1.00)
GFR calc Af Amer: >60 mL/min (ref >60)
GFR calc non Af Amer: >60 mL/min (ref >60)
GLUCOSE: 180 mg/dL — AB (ref 65–99)
POTASSIUM: 3.6 mmol/L (ref 3.5–5.1)
SODIUM: 131 mmol/L — AB (ref 135–145)
Total Protein: 6.5 g/dL (ref 6.5–8.1)

## 2015-09-23 MED ORDER — TRAZODONE HCL 50 MG PO TABS: ORAL_TABLET | ORAL | Status: DC

## 2015-09-23 MED ORDER — TRAMADOL HCL 50 MG PO TABS: 50.0000 mg | ORAL_TABLET | ORAL | Status: AC | PRN

## 2015-09-23 MED ORDER — BRIMONIDINE TARTRATE-TIMOLOL 0.2-0.5 % OP SOLN: 1.0000 [drp] | Freq: Two times a day (BID) | OPHTHALMIC | Status: AC

## 2015-09-23 NOTE — Progress Notes (Signed)
[facility]  Telephone:(336) [phone]  Fax:(336) [phone]     Kimberly Thornton DOB: 01-15-1951  MR#: [id number]  AQL#:[id number]  Patient Care Team: Marina Goodell, MD as PCP - General (Family Medicine) Tiney Rouge III, MD (Surgery) Benita Gutter, RN as Registered Nurse  CHIEF COMPLAINT:  Chief Complaint  Patient presents with  . Cancer of body of stomach  . Malignant neoplasm of right female breast, unspecified site     INTERVAL HISTORY:  Patient is here for continued follow-up regarding carcinoma of the stomach as well as the right breast. Patient was recently found to have multiple brain metastasis and has finished radiation therapy. She currently is on 2 mg of dexamethasone daily. Her overall general condition is declining but she is communicating with family as well as myself. She is having more difficulty with sleeping per her daughter. She is eating well. She continues with her tramadol as needed for pain. There is some question as to whether patient was actually referred to hospice services as daughter insisted that we did not discuss it with her. She is however in agreement with keeping her mom comfortable at this time and not pursuing any aggressive treatment.  REVIEW OF SYSTEMS:   Review of Systems  Constitutional: Positive for malaise/fatigue. Negative for fever, chills, weight loss and diaphoresis.  HENT: Negative.   Eyes: Negative.   Respiratory: Negative for cough, hemoptysis, sputum production, shortness of breath and wheezing.   Cardiovascular: Negative for chest pain, palpitations, orthopnea, claudication, leg swelling and PND.  Gastrointestinal: Negative for heartburn, nausea, vomiting, abdominal pain, diarrhea, constipation, blood in stool and melena.  Genitourinary: Negative.   Musculoskeletal: Negative.   Skin: Negative.   Neurological: Positive for weakness. Negative for dizziness, tingling, focal weakness and seizures.    Endo/Heme/Allergies: Does not bruise/bleed easily.  Psychiatric/Behavioral: Negative for depression. The patient has insomnia. The patient is not nervous/anxious.     As per HPI. Otherwise, a complete review of systems is negatve.  ONCOLOGY HISTORY: Oncology History   Subjective: Chief Complaint/Diagnosis:   1. Carcinoma of breast right upper quadrant  (4 cm tumor mass) 3 positive lymph node on needle biopsy estrogen receptor progesterone receptor negative.  HER-2/neu receptor positiveby IHC 3+ Diagnosis November, 2015, T2 N1 M0 tumor stage II disease 2. Muga SCAN OF THE HEART EJECTION FRACTION 66% 3. Abnormal PET scan suggesting a mass in the stomach, and liver metastases 4. Upper endoscopy given fungating mass biopsies consistent with stomach primary cancer, HER-2/neu by IHC 2+ and by fish is 1.86. 5. Liver biopsies negative for any malignancy. 6. BRCA 2 sequencing deleterious mutation in 2014 ins As mentioned above. 7. ENDOSCOPY ULTRASOUND SHOWS STOMACH TUMOR TO BE t3 n0 m0 8.  Has finished total 6 cycles of chemotherapy with Taxotere, carboplatinum, Herceptin with overall good response in the breast as well as, cancer (April of 2016) on maintenance Herceptin therapy from May of 2016 9.  Status post antrectomy and Billroth I gastroduodenostomy with intraoperative ultrasound of the liver on November 15, 2014 No evidence of residual adenocarcinoma.  Well-differentiated neuroendocrine tumor.  Stage I     Breast cancer [facility])    PAST MEDICAL HISTORY: Past Medical History  Diagnosis Date  . Right breast cancer with malignant cells in regional lymph nodes no greater than 0.2 mm and no more than 200 cells ([facility])   . Arthritis   . GERD (gastroesophageal reflux disease)   . Stomach cancer ([facility])   . Family history of adverse  reaction to anesthesia     mother had nausea  . Glaucoma   . Anxiety   . Brain metastasis ([place])     PAST SURGICAL HISTORY: Past Surgical History  Procedure  Laterality Date  . Abdominal hysterectomy    . Portacath placement  05/01/2014    Dr. Pat Patrick  . Gastrectomy N/A 11/11/2014    Procedure: Scarlett Presto I, GASTRODUODENOSTOMY, IOUS OF LIVER ;  Surgeon: Molly Maduro, MD;  Location: [facility] ORS;  Service: General;  Laterality: N/A;  . Simple mastectomy with axillary sentinel node biopsy Right 02/04/2015    Procedure: SIMPLE MASTECTOMY;  Surgeon: Dia Crawford III, MD;  Location: [facility] ORS;  Service: General;  Laterality: Right;    FAMILY HISTORY Family History  Problem Relation Age of Onset  . Breast cancer Mother   . Stomach cancer Mother 43  . Lung cancer Father   . Heart attack Father   . Hypertension Father     GYNECOLOGIC HISTORY:  No LMP recorded. Patient has had a hysterectomy.     ADVANCED DIRECTIVES:    HEALTH MAINTENANCE: Social History  Substance Use Topics  . Smoking status: Former Smoker    Quit date: 07/09/1996  . Smokeless tobacco: Never Used  . Alcohol Use: No     Colonoscopy:  PAP:  Bone density:  Mammogram:  Allergies  Allergen Reactions  . Codeine Other (See Comments) and Nausea Only    GI distress    Current Outpatient Prescriptions  Medication Sig Dispense Refill  . acetaminophen (TYLENOL) 500 MG tablet Take 500 mg by mouth every 6 (six) hours as needed for moderate pain or headache.    . ALPRAZolam (XANAX) 0.25 MG tablet TAKE 1 TABLET BY MOUTH TWICE A DAY AT 10AM AND 4PM 60 tablet 1  . brimonidine-timolol (COMBIGAN) 0.2-0.5 % ophthalmic solution Place 1 drop into the left eye every 12 (twelve) hours. 15 mL 1  . citalopram (CELEXA) 20 MG tablet Take 1 tablet (20 mg total) by mouth daily. 90 tablet 3  . levETIRAcetam (KEPPRA) 1000 MG tablet Take 1,000 mg by mouth.    . metoCLOPramide (REGLAN) 10 MG tablet TAKE 1 TABLET (10 MG TOTAL) BY MOUTH 2 (TWO) TIMES DAILY. 60 tablet 3  . Multiple Vitamins-Minerals (MULTIVITAMIN WITH MINERALS) tablet Take 1 tablet by mouth daily.    . multivitamin (GERI-TONIC)  LIQD Take 15 mLs by mouth daily.    Marland Kitchen omeprazole (PRILOSEC) 40 MG capsule TAKE ONE CAPSULE BY MOUTH TWICE A DAY 180 capsule 2  . ranitidine (ZANTAC) 150 MG tablet Take 150 mg by mouth 2 (two) times daily.    . traMADol (ULTRAM) 50 MG tablet Take 1 tablet (50 mg total) by mouth every 4 (four) hours as needed (Mild pain). 60 tablet 0  . zolpidem (AMBIEN) 10 MG tablet TAKE 1/2 TABLET BY MOUTH AT BEDTIME AS NEEDED FOR SLEEP MAY REPEAT IN 1 HOUR IF NEEDED  0  . dexamethasone (DECADRON) 4 MG tablet TAKE 1 TABLET BY MOUTH EVERY 6 HOURS (Patient taking differently: take 1/2 tab every 6 hours) 120 tablet 0  . levETIRAcetam (KEPPRA) 1000 MG tablet Take 1 tablet (1,000 mg total) by mouth 2 (two) times daily. 60 tablet 2  . traZODone (DESYREL) 50 MG tablet 1-2 tablets every night PRN 60 tablet 0   No current facility-administered medications for this visit.   Facility-Administered Medications Ordered in Other Visits  Medication Dose Route Frequency Provider Last Rate Last Dose  . heparin lock flush 100 unit/mL  500 Units Intravenous Once Forest Gleason, MD      . sodium chloride 0.9 % injection 10 mL  10 mL Intracatheter PRN Forest Gleason, MD        OBJECTIVE: There were no vitals taken for this visit.   There is no weight on file to calculate BMI.    ECOG FS:3 - Symptomatic, >50% confined to bed  General: No acute distress, sitting in wheelchair. Eyes: Pink conjunctiva, anicteric sclera. HEENT: Normocephalic, moist mucous membranes, clear oropharnyx. Lungs: Clear to auscultation bilaterally. Heart: Regular rate and rhythm. No rubs, murmurs, or gallops. Abdomen: Soft, nontender, nondistended. No organomegaly noted, normoactive bowel sounds. Musculoskeletal: No edema, cyanosis, or clubbing. Neuro: Alert, blank gaze over face, flat affect, answers some questions appropriately but minimal communication. Skin: No rashes or petechiae noted. Psych: Flat affect.   LAB RESULTS:  Appointment on 09/23/2015    Component Date Value Ref Range Status  . WBC 09/23/2015 7.6  3.6 - 11.0 K/uL Final  . RBC 09/23/2015 4.07  3.80 - 5.20 MIL/uL Final  . Hemoglobin 09/23/2015 12.7  12.0 - 16.0 g/dL Final  . HCT 09/23/2015 37.0  35.0 - 47.0 % Final  . MCV 09/23/2015 90.8  80.0 - 100.0 fL Final  . MCH 09/23/2015 31.2  26.0 - 34.0 pg Final  . MCHC 09/23/2015 34.4  32.0 - 36.0 g/dL Final  . RDW 09/23/2015 18.2* 11.5 - 14.5 % Final  . Platelets 09/23/2015 246  150 - 440 K/uL Final  . Neutrophils Relative % 09/23/2015 80   Final  . Neutro Abs 09/23/2015 6.1  1.4 - 6.5 K/uL Final  . Lymphocytes Relative 09/23/2015 16   Final  . Lymphs Abs 09/23/2015 1.2  1.0 - 3.6 K/uL Final  . Monocytes Relative 09/23/2015 4   Final  . Monocytes Absolute 09/23/2015 0.3  0.2 - 0.9 K/uL Final  . Eosinophils Relative 09/23/2015 0   Final  . Eosinophils Absolute 09/23/2015 0.0  0 - 0.7 K/uL Final  . Basophils Relative 09/23/2015 0   Final  . Basophils Absolute 09/23/2015 0.0  0 - 0.1 K/uL Final  . Sodium 09/23/2015 131* 135 - 145 mmol/L Final  . Potassium 09/23/2015 3.6  3.5 - 5.1 mmol/L Final  . Chloride 09/23/2015 102  101 - 111 mmol/L Final  . CO2 09/23/2015 21* 22 - 32 mmol/L Final  . Glucose, Bld 09/23/2015 180* 65 - 99 mg/dL Final  . BUN 09/23/2015 19  6 - 20 mg/dL Final  . Creatinine, Ser 09/23/2015 0.62  0.44 - 1.00 mg/dL Final  . Calcium 09/23/2015 8.4* 8.9 - 10.3 mg/dL Final  . Total Protein 09/23/2015 6.5  6.5 - 8.1 g/dL Final  . Albumin 09/23/2015 3.4* 3.5 - 5.0 g/dL Final  . AST 09/23/2015 31  15 - 41 U/L Final  . ALT 09/23/2015 19  14 - 54 U/L Final  . Alkaline Phosphatase 09/23/2015 55  38 - 126 U/L Final  . Total Bilirubin 09/23/2015 0.4  0.3 - 1.2 mg/dL Final  . GFR calc non Af Amer 09/23/2015 >60  >60 mL/min Final  . GFR calc Af Amer 09/23/2015 >60  >60 mL/min Final   Comment: (NOTE) The eGFR has been calculated using the CKD EPI equation. This calculation has not been validated in all clinical  situations. eGFR's persistently <60 mL/min signify possible Chronic Kidney Disease.   . Anion gap 09/23/2015 8  5 - 15 Final    STUDIES: No results found.  ASSESSMENT:  Carcinoma of right  breast. Carcinoma of stomach.  PLAN:   1. Carcinoma of right breast with concurrent carcinoma of stomach. Patient recently diagnosed with multiple brain metastasis and is status post radiation therapy with some overall improvement. Her overall general condition has declined the patient's mentation has improved only slightly. She is now answering questions and is eating. She is currently on dexamethasone 2 mg daily. Patient continues to have issues with insomnia. She was tried on Ambien with no improvement. We will try trazodone 50 mg to 100 mg at bedtime. Advised daughter to give her one tablet daily and if no improvement to give her a second tablet 1-2 hours later. It is understanding that patient and daughter were agreeable to hospice but unable to confirm with daughter if hospice services are actually in place. She does agree with no further aggressive treatment. We will continue with routine follow-up in approximately 1 month.  Patient expressed understanding and was in agreement with this plan. She also understands that She can call clinic at any time with any questions, concerns, or complaints.   Dr. Oliva Bustard was available for consultation and review of plan of care for this patient.  Breast cancer ([place])   Staging form: Breast, AJCC 7th Edition     Clinical: Stage IV (T2, N1, M1) - Signed by Forest Gleason, MD on 10/19/2014 Cancer of body of stomach ([place])   Staging form: Stomach, AJCC 7th Edition     Clinical: Stage IIA (T3, N0, M0) - Signed by Forest Gleason, MD on 12/28/2014     Pathologic: Stage IA (T1, N0, cM0) - Unsigned   Kimberly Kanner, NP   09/23/2015 4:19 PM

## 2015-09-30 ENCOUNTER — Other Ambulatory Visit: Payer: Self-pay | Admitting: *Deleted

## 2015-09-30 MED ORDER — LEVETIRACETAM 1000 MG PO TABS: 1000.0000 mg | ORAL_TABLET | Freq: Two times a day (BID) | ORAL | Status: AC

## 2015-09-30 MED ORDER — LEVETIRACETAM 1000 MG PO TABS: 1000.0000 mg | ORAL_TABLET | Freq: Two times a day (BID) | ORAL | Status: DC

## 2015-09-30 NOTE — Addendum Note (Signed)
Addended by: Betti Cruz on: 09/30/2015 10:47 AM   Modules accepted: Orders

## 2015-10-01 ENCOUNTER — Telehealth: Payer: Self-pay

## 2015-10-01 NOTE — Telephone Encounter (Signed)
Called patient to schedule her a 6 months follow and her daughter answered. I told her that I was calling to schedule her a 6 months follow up appointment with Dr. Azalee Course. Her daughter stated that her mother has Brain cancer and was not going to be able to come and see our doctor. I told her that I would let Dr. Azalee Course know. I asked if in case Dr. Azalee Course would like to speak with her, if she could call. She stated that she would definitely would.   I would notify Dr. Azalee Course about patient's condition.

## 2015-10-01 NOTE — Telephone Encounter (Signed)
-----   Message from [place], [place] sent at 04/03/2015 11:17 AM EDT ----- Called patient and schedule a 6 month follow up for breast and gastric with Dr. Azalee Course.

## 2015-10-02 ENCOUNTER — Other Ambulatory Visit: Payer: Self-pay | Admitting: Family Medicine

## 2015-10-02 ENCOUNTER — Telehealth: Payer: Self-pay | Admitting: *Deleted

## 2015-10-02 MED ORDER — MIRTAZAPINE 15 MG PO TABS: 15.0000 mg | ORAL_TABLET | Freq: Every day | ORAL | Status: DC

## 2015-10-02 NOTE — Telephone Encounter (Signed)
Called to report that the med given to help her sleep is not really working, she is still not sleeping and is worn out. She states you mentioned something else could be tried if this did not work

## 2015-10-02 NOTE — Telephone Encounter (Signed)
Kimberly Kanner, NP  Betti Cruz, RN           Tell her to stop trazodone and I sent in Remeron to pharmacy.      Kimberly Thornton informed of this and repeated back to me

## 2015-10-06 ENCOUNTER — Telehealth: Payer: Self-pay | Admitting: *Deleted

## 2015-10-06 NOTE — Telephone Encounter (Signed)
Not sleeping, will be up for 48 hours then crashes for 36 hours. She has also become physical hitting and kicking people, climbing over the bed rails. I discussed this with L Herring, AGNP-C who asked me to see if they are ready for Hospice. I spoke with Otila Kluver who stated that very time hospice has been mentioned, [place] goes off the deep end, she will discuss with Mr Vertell Limber and call me back

## 2015-10-06 NOTE — Telephone Encounter (Addendum)
Kimberly Thornton called back and has agreed to Hospice, she would like to be the contact person. Angie Fava is faxing referral

## 2015-10-07 ENCOUNTER — Telehealth: Payer: Self-pay | Admitting: *Deleted

## 2015-10-07 MED ORDER — QUETIAPINE FUMARATE 25 MG PO TABS: 25.0000 mg | ORAL_TABLET | Freq: Every day | ORAL | Status: AC

## 2015-10-07 NOTE — Telephone Encounter (Signed)
Asking if they can try seroquel 25 mg at hs and if works increase to 50 mg after 3 days. OK per L Herring, AGNP-C

## 2015-10-13 ENCOUNTER — Telehealth: Payer: Self-pay | Admitting: *Deleted

## 2015-10-13 NOTE — Telephone Encounter (Signed)
There is an interaction between Reglan and Seroquel, please let them know you are aware of this. She has been on both of these for a week now.

## 2015-10-14 ENCOUNTER — Other Ambulatory Visit: Payer: Self-pay | Admitting: Oncology

## 2015-10-14 NOTE — Telephone Encounter (Signed)
Per L Herring, AGNP-C aware and wants to continue as ordered. Pharmacy notified

## 2015-10-19 ENCOUNTER — Other Ambulatory Visit: Payer: Self-pay | Admitting: Oncology

## 2015-10-28 ENCOUNTER — Other Ambulatory Visit: Payer: BLUE CROSS/BLUE SHIELD

## 2015-10-28 ENCOUNTER — Ambulatory Visit: Payer: BLUE CROSS/BLUE SHIELD | Admitting: Family Medicine

## 2015-10-29 ENCOUNTER — Other Ambulatory Visit: Payer: Self-pay | Admitting: *Deleted

## 2015-10-29 MED ORDER — MIRTAZAPINE 15 MG PO TABS: 15.0000 mg | ORAL_TABLET | Freq: Every day | ORAL | Status: AC

## 2015-11-07 ENCOUNTER — Other Ambulatory Visit: Payer: Self-pay | Admitting: *Deleted

## 2015-11-07 MED ORDER — ALPRAZOLAM 0.25 MG PO TABS: ORAL_TABLET | ORAL | Status: AC

## 2015-12-06 DEATH — deceased

## 2015-12-27 ENCOUNTER — Other Ambulatory Visit: Payer: Self-pay | Admitting: Family Medicine

## 2016-09-02 IMAGING — NM NM CARDIA MUGA REST
4 series · 24 of 24 positions shown · non-contrast
Comparison: 08/07/2014

CLINICAL DATA: Breast cancer.  Undergoing chemotherapy.

EXAM:
NUCLEAR MEDICINE CARDIAC BLOOD POOL IMAGING (MUGA)
TECHNIQUE: Cardiac multi-gated acquisition was performed at rest following
intravenous injection of Vc-99m labeled red blood cells.
RADIOPHARMACEUTICALS:  20.58 mCi Vc-99m MDP in-vitro labeled red
blood cells IV

[Series 1000: ant-gated · 3.30mm/px · 6 of 24 frames shown]
[frame 3/24]
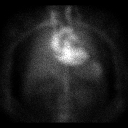
[frame 7/24]
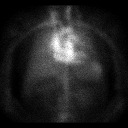
[frame 11/24]
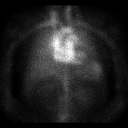
[frame 15/24]
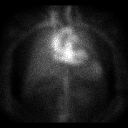
[frame 19/24]
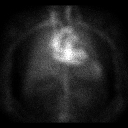
[frame 23/24]
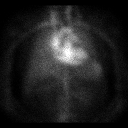

[Series 1000: lao 70-gated · 3.30mm/px · 6 of 24 frames shown]
[frame 3/24]
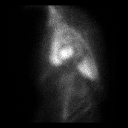
[frame 7/24]
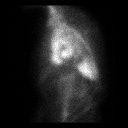
[frame 11/24]
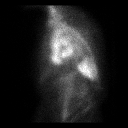
[frame 15/24]
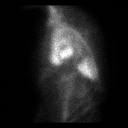
[frame 19/24]
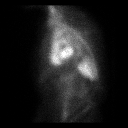
[frame 23/24]
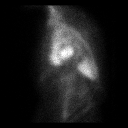

[Series 1000: lao 45-gated · 3.30mm/px · 6 of 24 frames shown]
[frame 3/24]
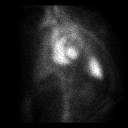
[frame 7/24]
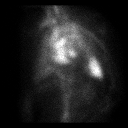
[frame 11/24]
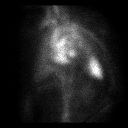
[frame 15/24]
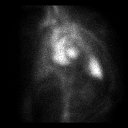
[frame 19/24]
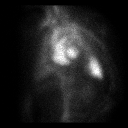
[frame 23/24]
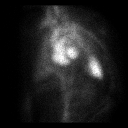

[Series 1000: lao 45-gated (results) · 3.30mm/px · 6 of 24 frames shown]
[frame 3/24]
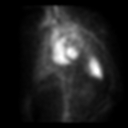
[frame 7/24]
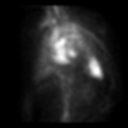
[frame 11/24]
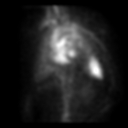
[frame 15/24]
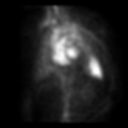
[frame 19/24]
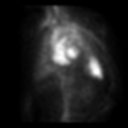
[frame 23/24]
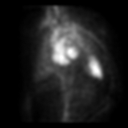

[24 of 24 positions shown; findings below may reference images not displayed]

FINDINGS: The left ventricular ejection fraction is equal to 62.2%. Previously
this measured 65%. There is normal left ventricular wall motion.
IMPRESSION: 1. Normal left ventricular ejection fraction which is not
significantly changed from previous exam.
